# Patient Record
Sex: Female | Born: 1975 | Race: White | Hispanic: No | Marital: Single | Smoking: Never smoker
Health system: Southern US, Community
[De-identification: ages and names within clinical notes are randomized; demographics above are authoritative.]

## PROBLEM LIST (undated history)

## (undated) DIAGNOSIS — Z8669 Personal history of other diseases of the nervous system and sense organs: Secondary | ICD-10-CM

## (undated) DIAGNOSIS — E785 Hyperlipidemia, unspecified: Secondary | ICD-10-CM

## (undated) DIAGNOSIS — J45909 Unspecified asthma, uncomplicated: Secondary | ICD-10-CM

## (undated) DIAGNOSIS — E282 Polycystic ovarian syndrome: Secondary | ICD-10-CM

## (undated) DIAGNOSIS — N809 Endometriosis, unspecified: Secondary | ICD-10-CM

## (undated) HISTORY — PX: OTHER SURGICAL HISTORY: SHX169

## (undated) HISTORY — DX: Endometriosis, unspecified: N80.9

## (undated) HISTORY — DX: Hyperlipidemia, unspecified: E78.5

## (undated) HISTORY — PX: WISDOM TOOTH EXTRACTION: SHX21

## (undated) HISTORY — DX: Polycystic ovarian syndrome: E28.2

## (undated) HISTORY — DX: Unspecified asthma, uncomplicated: J45.909

## (undated) HISTORY — PX: SEPTOPLASTY: SUR1290

---
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
## 1997-12-11 ENCOUNTER — Other Ambulatory Visit: Admission: RE | Admit: 1997-12-11 | Discharge: 1997-12-11 | Payer: Self-pay | Admitting: Obstetrics and Gynecology

## 1998-11-05 ENCOUNTER — Encounter: Payer: Self-pay | Admitting: Emergency Medicine

## 1998-11-05 ENCOUNTER — Emergency Department (HOSPITAL_COMMUNITY): Admission: EM | Admit: 1998-11-05 | Discharge: 1998-11-06 | Payer: Self-pay | Admitting: Emergency Medicine

## 1999-02-21 ENCOUNTER — Encounter: Payer: Self-pay | Admitting: Internal Medicine

## 1999-02-21 ENCOUNTER — Ambulatory Visit (HOSPITAL_COMMUNITY): Admission: RE | Admit: 1999-02-21 | Discharge: 1999-02-21 | Payer: Self-pay | Admitting: Internal Medicine

## 2000-05-30 ENCOUNTER — Emergency Department (HOSPITAL_COMMUNITY): Admission: EM | Admit: 2000-05-30 | Discharge: 2000-05-30 | Payer: Self-pay | Admitting: *Deleted

## 2000-06-02 ENCOUNTER — Encounter: Admission: RE | Admit: 2000-06-02 | Discharge: 2000-06-02 | Payer: Self-pay | Admitting: Urology

## 2000-06-02 ENCOUNTER — Encounter: Payer: Self-pay | Admitting: Urology

## 2000-06-20 ENCOUNTER — Encounter: Payer: Self-pay | Admitting: Urology

## 2000-06-20 ENCOUNTER — Encounter: Admission: RE | Admit: 2000-06-20 | Discharge: 2000-06-20 | Payer: Self-pay | Admitting: Urology

## 2000-07-04 ENCOUNTER — Encounter: Payer: Self-pay | Admitting: Urology

## 2000-07-04 ENCOUNTER — Ambulatory Visit (HOSPITAL_COMMUNITY): Admission: RE | Admit: 2000-07-04 | Discharge: 2000-07-04 | Payer: Self-pay | Admitting: Urology

## 2002-03-14 ENCOUNTER — Other Ambulatory Visit: Admission: RE | Admit: 2002-03-14 | Discharge: 2002-03-14 | Payer: Self-pay | Admitting: Gynecology

## 2003-04-22 ENCOUNTER — Encounter (INDEPENDENT_AMBULATORY_CARE_PROVIDER_SITE_OTHER): Payer: Self-pay

## 2003-04-22 ENCOUNTER — Ambulatory Visit (HOSPITAL_BASED_OUTPATIENT_CLINIC_OR_DEPARTMENT_OTHER): Admission: RE | Admit: 2003-04-22 | Discharge: 2003-04-22 | Payer: Self-pay | Admitting: Gynecology

## 2003-04-22 ENCOUNTER — Ambulatory Visit (HOSPITAL_COMMUNITY): Admission: RE | Admit: 2003-04-22 | Discharge: 2003-04-22 | Payer: Self-pay | Admitting: Gynecology

## 2003-06-23 ENCOUNTER — Other Ambulatory Visit: Admission: RE | Admit: 2003-06-23 | Discharge: 2003-06-23 | Payer: Self-pay | Admitting: Gynecology

## 2004-05-05 ENCOUNTER — Ambulatory Visit: Payer: Self-pay | Admitting: Internal Medicine

## 2004-07-01 ENCOUNTER — Other Ambulatory Visit: Admission: RE | Admit: 2004-07-01 | Discharge: 2004-07-01 | Payer: Self-pay | Admitting: Gynecology

## 2004-09-03 ENCOUNTER — Ambulatory Visit: Payer: Self-pay | Admitting: Internal Medicine

## 2004-09-20 ENCOUNTER — Ambulatory Visit: Payer: Self-pay | Admitting: Internal Medicine

## 2004-11-19 ENCOUNTER — Ambulatory Visit: Payer: Self-pay | Admitting: Internal Medicine

## 2004-12-03 ENCOUNTER — Encounter: Admission: RE | Admit: 2004-12-03 | Discharge: 2005-03-03 | Payer: Self-pay | Admitting: Internal Medicine

## 2005-03-31 ENCOUNTER — Ambulatory Visit (HOSPITAL_BASED_OUTPATIENT_CLINIC_OR_DEPARTMENT_OTHER): Admission: RE | Admit: 2005-03-31 | Discharge: 2005-03-31 | Payer: Self-pay | Admitting: Otolaryngology

## 2005-03-31 ENCOUNTER — Ambulatory Visit (HOSPITAL_COMMUNITY): Admission: RE | Admit: 2005-03-31 | Discharge: 2005-03-31 | Payer: Self-pay | Admitting: Otolaryngology

## 2005-04-25 ENCOUNTER — Ambulatory Visit: Payer: Self-pay | Admitting: Internal Medicine

## 2005-05-10 ENCOUNTER — Ambulatory Visit: Payer: Self-pay | Admitting: Internal Medicine

## 2005-06-28 ENCOUNTER — Ambulatory Visit: Payer: Self-pay | Admitting: Internal Medicine

## 2005-07-04 ENCOUNTER — Other Ambulatory Visit: Admission: RE | Admit: 2005-07-04 | Discharge: 2005-07-04 | Payer: Self-pay | Admitting: Gynecology

## 2005-07-26 ENCOUNTER — Ambulatory Visit: Payer: Self-pay | Admitting: Internal Medicine

## 2005-08-10 ENCOUNTER — Ambulatory Visit: Payer: Self-pay | Admitting: Internal Medicine

## 2005-08-19 ENCOUNTER — Ambulatory Visit: Payer: Self-pay | Admitting: Internal Medicine

## 2005-08-23 ENCOUNTER — Ambulatory Visit: Payer: Self-pay | Admitting: Internal Medicine

## 2005-09-02 ENCOUNTER — Ambulatory Visit: Payer: Self-pay | Admitting: Internal Medicine

## 2006-07-18 DIAGNOSIS — E282 Polycystic ovarian syndrome: Secondary | ICD-10-CM | POA: Insufficient documentation

## 2006-07-19 ENCOUNTER — Other Ambulatory Visit: Admission: RE | Admit: 2006-07-19 | Discharge: 2006-07-19 | Payer: Self-pay | Admitting: Gynecology

## 2006-07-26 ENCOUNTER — Ambulatory Visit: Payer: Self-pay | Admitting: Internal Medicine

## 2006-11-09 ENCOUNTER — Ambulatory Visit: Payer: Self-pay | Admitting: Internal Medicine

## 2006-11-09 ENCOUNTER — Encounter: Payer: Self-pay | Admitting: Internal Medicine

## 2006-11-09 LAB — CONVERTED CEMR LAB
ALT: 18 units/L (ref 0–40)
Albumin: 4 g/dL (ref 3.5–5.2)
Alkaline Phosphatase: 74 units/L (ref 39–117)
BUN: 10 mg/dL (ref 6–23)
Basophils Absolute: 0 10*3/uL (ref 0.0–0.1)
Basophils Relative: 0.4 % (ref 0.0–1.0)
CO2: 28 meq/L (ref 19–32)
Calcium: 9.6 mg/dL (ref 8.4–10.5)
Cholesterol: 228 mg/dL (ref 0–200)
Eosinophils Absolute: 0.2 10*3/uL (ref 0.0–0.6)
GFR calc Af Amer: 94 mL/min
GFR calc non Af Amer: 78 mL/min
HDL: 73.9 mg/dL (ref >39.0)
Lymphocytes Relative: 32.5 % (ref 12.0–46.0)
MCHC: 34.9 g/dL (ref 30.0–36.0)
Monocytes Relative: 6.4 % (ref 3.0–11.0)
Neutro Abs: 3.6 10*3/uL (ref 1.4–7.7)
Platelets: 274 10*3/uL (ref 150–400)
Potassium: 4.3 meq/L (ref 3.5–5.1)
TSH: 3.56 microintl units/mL (ref 0.35–5.50)
VLDL: 32 mg/dL (ref 0–40)

## 2007-07-30 ENCOUNTER — Telehealth (INDEPENDENT_AMBULATORY_CARE_PROVIDER_SITE_OTHER): Payer: Self-pay | Admitting: *Deleted

## 2007-07-31 ENCOUNTER — Ambulatory Visit: Payer: Self-pay | Admitting: Internal Medicine

## 2007-08-10 ENCOUNTER — Ambulatory Visit: Payer: Self-pay | Admitting: Internal Medicine

## 2007-08-10 ENCOUNTER — Encounter (INDEPENDENT_AMBULATORY_CARE_PROVIDER_SITE_OTHER): Payer: Self-pay | Admitting: *Deleted

## 2007-08-10 LAB — CONVERTED CEMR LAB
ALT: 15 units/L (ref 0–35)
AST: 19 units/L (ref 0–37)
Basophils Relative: 1.2 % — ABNORMAL HIGH (ref 0.0–1.0)
Bilirubin, Direct: 0.1 mg/dL (ref 0.0–0.3)
CO2: 26 meq/L (ref 19–32)
Calcium: 9.5 mg/dL (ref 8.4–10.5)
Eosinophils Absolute: 0.3 10*3/uL (ref 0.0–0.6)
Eosinophils Relative: 4.3 % (ref 0.0–5.0)
GFR calc Af Amer: 107 mL/min
GFR calc non Af Amer: 88 mL/min
Glucose, Bld: 90 mg/dL (ref 70–99)
HCT: 41.5 % (ref 36.0–46.0)
Hemoglobin: 14.1 g/dL (ref 12.0–15.0)
Lymphocytes Relative: 41.1 % (ref 12.0–46.0)
MCV: 88.6 fL (ref 78.0–100.0)
Mono Screen: NEGATIVE
Neutro Abs: 3.2 10*3/uL (ref 1.4–7.7)
Neutrophils Relative %: 50.1 % (ref 43.0–77.0)
Sodium: 138 meq/L (ref 135–145)
Total Protein: 6.7 g/dL (ref 6.0–8.3)
WBC: 6.4 10*3/uL (ref 4.5–10.5)

## 2007-08-11 ENCOUNTER — Ambulatory Visit: Payer: Self-pay | Admitting: Family Medicine

## 2007-11-15 ENCOUNTER — Telehealth (INDEPENDENT_AMBULATORY_CARE_PROVIDER_SITE_OTHER): Payer: Self-pay | Admitting: *Deleted

## 2007-12-25 ENCOUNTER — Ambulatory Visit: Payer: Self-pay | Admitting: Internal Medicine

## 2008-04-01 ENCOUNTER — Encounter: Payer: Self-pay | Admitting: Internal Medicine

## 2008-04-02 ENCOUNTER — Ambulatory Visit: Payer: Self-pay | Admitting: Internal Medicine

## 2008-04-02 LAB — CONVERTED CEMR LAB: Cholesterol, target level: 200 mg/dL

## 2008-04-04 ENCOUNTER — Encounter (INDEPENDENT_AMBULATORY_CARE_PROVIDER_SITE_OTHER): Payer: Self-pay | Admitting: *Deleted

## 2008-04-04 LAB — CONVERTED CEMR LAB
ALT: 16 units/L (ref 0–35)
AST: 19 units/L (ref 0–37)
Alkaline Phosphatase: 70 units/L (ref 39–117)
Basophils Absolute: 0 10*3/uL (ref 0.0–0.1)
Bilirubin, Direct: 0.2 mg/dL (ref 0.0–0.3)
CO2: 26 meq/L (ref 19–32)
Calcium: 9.2 mg/dL (ref 8.4–10.5)
Chloride: 107 meq/L (ref 96–112)
Cholesterol: 165 mg/dL (ref 0–200)
Glucose, Bld: 81 mg/dL (ref 70–99)
LDL Cholesterol: 74 mg/dL (ref 0–99)
Lymphocytes Relative: 38.1 % (ref 12.0–46.0)
MCHC: 34.8 g/dL (ref 30.0–36.0)
Monocytes Relative: 6.5 % (ref 3.0–12.0)
Neutrophils Relative %: 51.8 % (ref 43.0–77.0)
Platelets: 238 10*3/uL (ref 150–400)
Potassium: 4.4 meq/L (ref 3.5–5.1)
RDW: 11.5 % (ref 11.5–14.6)
Sodium: 140 meq/L (ref 135–145)
TSH: 3.6 microintl units/mL (ref 0.35–5.50)
Total Bilirubin: 1 mg/dL (ref 0.3–1.2)
Total CHOL/HDL Ratio: 2.4
Triglycerides: 108 mg/dL (ref 0–149)
VLDL: 22 mg/dL (ref 0–40)

## 2008-05-19 ENCOUNTER — Ambulatory Visit: Payer: Self-pay | Admitting: Internal Medicine

## 2008-06-30 ENCOUNTER — Ambulatory Visit: Payer: Self-pay | Admitting: Internal Medicine

## 2008-06-30 DIAGNOSIS — J45990 Exercise induced bronchospasm: Secondary | ICD-10-CM | POA: Insufficient documentation

## 2008-07-09 ENCOUNTER — Telehealth (INDEPENDENT_AMBULATORY_CARE_PROVIDER_SITE_OTHER): Payer: Self-pay | Admitting: *Deleted

## 2008-07-17 ENCOUNTER — Telehealth (INDEPENDENT_AMBULATORY_CARE_PROVIDER_SITE_OTHER): Payer: Self-pay | Admitting: *Deleted

## 2009-04-13 ENCOUNTER — Ambulatory Visit: Payer: Self-pay | Admitting: Internal Medicine

## 2009-04-13 DIAGNOSIS — E785 Hyperlipidemia, unspecified: Secondary | ICD-10-CM | POA: Insufficient documentation

## 2009-04-14 ENCOUNTER — Encounter: Payer: Self-pay | Admitting: Internal Medicine

## 2009-04-17 ENCOUNTER — Encounter (INDEPENDENT_AMBULATORY_CARE_PROVIDER_SITE_OTHER): Payer: Self-pay | Admitting: *Deleted

## 2009-04-17 LAB — CONVERTED CEMR LAB
Albumin: 3.9 g/dL (ref 3.5–5.2)
Basophils Relative: 0.6 % (ref 0.0–3.0)
Bilirubin, Direct: 0.1 mg/dL (ref 0.0–0.3)
CO2: 27 meq/L (ref 19–32)
Chloride: 108 meq/L (ref 96–112)
Creatinine, Ser: 1 mg/dL (ref 0.4–1.2)
Eosinophils Absolute: 0.3 10*3/uL (ref 0.0–0.7)
Free T4: 0.8 ng/dL (ref 0.6–1.6)
Glucose, Bld: 85 mg/dL (ref 70–99)
MCHC: 34.4 g/dL (ref 30.0–36.0)
MCV: 92.7 fL (ref 78.0–100.0)
Monocytes Absolute: 0.3 10*3/uL (ref 0.1–1.0)
Neutro Abs: 2.6 10*3/uL (ref 1.4–7.7)
Neutrophils Relative %: 44.8 % (ref 43.0–77.0)
RBC: 4.54 M/uL (ref 3.87–5.11)
RDW: 11.6 % (ref 11.5–14.6)
Total Protein: 7 g/dL (ref 6.0–8.3)

## 2009-04-22 ENCOUNTER — Ambulatory Visit: Payer: Self-pay | Admitting: Internal Medicine

## 2010-05-26 ENCOUNTER — Ambulatory Visit: Payer: Self-pay | Admitting: Family Medicine

## 2010-06-11 ENCOUNTER — Ambulatory Visit: Payer: Self-pay | Admitting: Internal Medicine

## 2010-06-11 ENCOUNTER — Encounter: Payer: Self-pay | Admitting: Internal Medicine

## 2010-06-15 LAB — CONVERTED CEMR LAB
ALT: 16 units/L (ref 0–35)
BUN: 11 mg/dL (ref 6–23)
Bilirubin, Direct: 0.1 mg/dL (ref 0.0–0.3)
CO2: 27 meq/L (ref 19–32)
Chloride: 108 meq/L (ref 96–112)
Creatinine, Ser: 0.8 mg/dL (ref 0.4–1.2)
Eosinophils Absolute: 0.2 10*3/uL (ref 0.0–0.7)
Glucose, Bld: 75 mg/dL (ref 70–99)
HCT: 42 % (ref 36.0–46.0)
Lymphs Abs: 2.4 10*3/uL (ref 0.7–4.0)
MCHC: 34.3 g/dL (ref 30.0–36.0)
MCV: 91.9 fL (ref 78.0–100.0)
Monocytes Absolute: 0.3 10*3/uL (ref 0.1–1.0)
Neutrophils Relative %: 55.5 % (ref 43.0–77.0)
Platelets: 302 10*3/uL (ref 150.0–400.0)
Potassium: 4.5 meq/L (ref 3.5–5.1)
TSH: 2.62 microintl units/mL (ref 0.35–5.50)
Total Bilirubin: 0.8 mg/dL (ref 0.3–1.2)
Total Protein: 7.4 g/dL (ref 6.0–8.3)

## 2010-07-15 NOTE — Assessment & Plan Note (Signed)
Summary: HEAD & CHEST CONGESTION/RH.......Marland Kitchen   Vital Signs:  Patient profile:   35 year old female Height:      66.25 inches Weight:      154.2 pounds BMI:     24.79 O2 Sat:      98 % on Room air Temp:     98.6 degrees F oral Pulse rate:   65 / minute Pulse rhythm:   regular BP sitting:   108 / 64  (left arm) Cuff size:   regular  Vitals Entered By: Almeta Monas CMA Duncan Dull) (May 26, 2010 11:12 AM)  O2 Flow:  Room air CC: x1 week c/o cough, congestion, sinus pressure/headache and green bloody mucus, URI symptoms   History of Present Illness:       This is a 35 year old woman who presents with URI symptoms.  The symptoms began 3 weeks ago.  +b/l sinus pressure.  The patient complains of nasal congestion, purulent nasal discharge, productive cough, and sick contacts.  The patient denies fever, low-grade fever (<100.5 degrees), fever of 100.5-103 degrees, fever of 103.1-104 degrees, fever to >104 degrees, stiff neck, dyspnea, wheezing, rash, vomiting, diarrhea, use of an antipyretic, and response to antipyretic.  The patient also reports sneezing and headache.  The patient denies the following risk factors for Strep sinusitis: unilateral facial pain, unilateral nasal discharge, poor response to decongestant, double sickening, tooth pain, Strep exposure, tender adenopathy, and absence of cough.    Current Medications (verified): 1)  Lipitor 20 Mg Tabs (Atorvastatin Calcium) .Marland Kitchen.. 1 By Mouth 3 X Weekly 2)  Ocella 3-0.03 Mg  Tabs (Drospirenone-Ethinyl Estradiol) .Marland Kitchen.. 1 Qd 3)  Baby Aspirin 81 Mg  Chew (Aspirin) .Marland Kitchen.. 1 Qd 4)  Cortisporin 3.5-10000-1  Susp (Neomycin-Polymyxin-Hc) .... 3 Drops  Qid As Needed 5)  Loratadine 10 Mg  Tabs (Loratadine) .Marland Kitchen.. 1 By Mouth Once Daily As Needed 6)  Darvocet A500 100-500 Mg Tabs (Propoxyphene N-Apap) .Marland Kitchen.. 1-2 Q 6 Hrs As Needed 7)  Ventolin Hfa 108 (90 Base) Mcg/act Aers (Albuterol Sulfate) .Marland Kitchen.. 1-2 Puffs Q 4-6 Hrs As Needed Cough & Pre Exercise 8)   Singulair 10 Mg Tabs (Montelukast Sodium) .Marland Kitchen.. 1 Once Daily As Needed Cough 9)  Citalopram Hydrobromide 20 Mg Tabs (Citalopram Hydrobromide) .Marland Kitchen.. 1 Once Daily 10)  Augmentin 875-125 Mg Tabs (Amoxicillin-Pot Clavulanate) .Marland Kitchen.. 1 By Mouth Two Times A Day 11)  Flonase 50 Mcg/act Susp (Fluticasone Propionate) .... 2 Sprays Each Nostril Once Daily 12)  Cheratussin Ac 100-10 Mg/53ml Syrp (Guaifenesin-Codeine) .Marland Kitchen.. 1 -2 Tsp By Mouth At Bedtime As Needed Cough  Allergies (verified): 1)  ! Sulfa  Past History:  Past Medical History: Last updated: 04/13/2009 Renal calculus X1 (calcium stone); Endometriosis; Polycystic Ovary with PMH of elevated testosterone; EIB ,PMH of Hyperlipidemia: LDL 105 (1981 total/ 1794 small dense particles), HDL 66, TG 99; LDL goal = < 75  Past Surgical History: Last updated: 04/13/2009 Discectomy L 4 in  1990;ovarian cystectomy;septoplasty;otic tubes; labial cystectomy; G1 P0  Family History: Last updated: 04/13/2009 Father: DM,gout,DDD Mother: MI @ 49, smoker Siblings: neg; MGF   MI pre 20; PGF seizure; ; cousin DM  Social History: Last updated: 04/13/2009 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 Father: DM,gout,DDD Mother: MI @ 70, smoker Siblings: neg; MGF   MI pre 35; PGF seizure; ; cousin  DM  Social History: Reviewed history from 04/13/2009 and no changes required. 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  Otoscopic examination reveals clear canals, tympanic membranes are intact bilaterally without bulging, retraction, inflammation or discharge. Hearing is grossly normal bilaterally. Nose:  L frontal sinus tenderness, L maxillary sinus tenderness, R frontal sinus tenderness, and R maxillary sinus tenderness.   Mouth:  Oral mucosa and oropharynx without lesions or exudates.  Teeth in good repair. Neck:  No deformities, masses, or tenderness noted. Lungs:  Normal respiratory effort, chest expands symmetrically. Lungs are clear to auscultation, no crackles or wheezes. Heart:  normal rate and no murmur.   Extremities:  No clubbing, cyanosis, edema, or deformity noted with normal full range of motion of all joints.   Psych:  Cognition and judgment appear intact. Alert and cooperative with normal attention span and concentration. No apparent delusions, illusions, hallucinations   Impression & Recommendations:  Problem # 1:  SINUSITIS - ACUTE-NOS (ICD-461.9)  Her updated medication list for this problem includes:    Augmentin 875-125 Mg Tabs (Amoxicillin-pot clavulanate) .Marland Kitchen... 1 by mouth two times a day    Flonase 50 Mcg/act Susp (Fluticasone propionate) .Marland Kitchen... 2 sprays each nostril once daily    Cheratussin Ac 100-10 Mg/63ml Syrp (Guaifenesin-codeine) .Marland Kitchen... 1 -2 tsp by mouth at bedtime as needed cough  Instructed on treatment. Call if symptoms persist or worsen.   Complete Medication List: 1)  Lipitor 20 Mg Tabs (Atorvastatin calcium) .Marland Kitchen.. 1 by mouth 3 x weekly 2)  Ocella 3-0.03 Mg Tabs (Drospirenone-ethinyl estradiol) .Marland Kitchen.. 1 qd 3)  Baby Aspirin 81 Mg Chew (Aspirin) .Marland Kitchen.. 1 qd 4)  Cortisporin 3.5-10000-1 Susp (Neomycin-polymyxin-hc) .... 3 drops  qid as needed 5)  Loratadine 10 Mg Tabs (Loratadine) .Marland Kitchen.. 1 by mouth once daily as needed 6)  Darvocet A500 100-500 Mg Tabs (Propoxyphene n-apap) .Marland Kitchen.. 1-2 q 6 hrs as needed 7)  Ventolin Hfa 108 (90 Base) Mcg/act Aers  (Albuterol sulfate) .Marland Kitchen.. 1-2 puffs q 4-6 hrs as needed cough & pre exercise 8)  Singulair 10 Mg Tabs (Montelukast sodium) .Marland Kitchen.. 1 once daily as needed cough 9)  Citalopram Hydrobromide 20 Mg Tabs (Citalopram hydrobromide) .Marland Kitchen.. 1 once daily 10)  Augmentin 875-125 Mg Tabs (Amoxicillin-pot clavulanate) .Marland Kitchen.. 1 by mouth two times a day 11)  Flonase 50 Mcg/act Susp (Fluticasone propionate) .... 2 sprays each nostril once daily 12)  Cheratussin Ac 100-10 Mg/46ml Syrp (Guaifenesin-codeine) .Marland Kitchen.. 1 -2 tsp by mouth at bedtime as needed cough  Patient Instructions: 1)  Acute sinusitis symptoms for less than 10 days are not helped by antibiotics. Use warm moist compresses, and over the counter decongestants( only as directed). Call if no improvement in 5-7 days, sooner if increasing pain, fever, or new symptoms.  Prescriptions: CHERATUSSIN AC 100-10 MG/5ML SYRP (GUAIFENESIN-CODEINE) 1 -2 tsp by mouth at bedtime as needed cough  #6 oz x 0   Entered and Authorized by:   Loreen Freud DO   Signed by:   Loreen Freud DO on 05/26/2010   Method used:   Print then Give to Patient   RxID:   [id number] FLONASE 50 MCG/ACT SUSP (FLUTICASONE PROPIONATE) 2 sprays each nostril once daily  #1 x 0   Entered and Authorized by:  Loreen Freud DO   Signed by:   Loreen Freud DO on 05/26/2010   Method used:   Electronically to        [id number] (retail)       [address]       [place], [place]  [address]       Ph: [phone] or [phone]       Fax: [phone]   RxID:   [id number] AUGMENTIN 875-125 MG TABS (AMOXICILLIN-POT CLAVULANATE) 1 by mouth two times a day  #20 x 0   Entered and Authorized by:   Loreen Freud DO   Signed by:   Loreen Freud DO on 05/26/2010   Method used:   Electronically to        [id number] (retail)       [address]       [place], [place]  [address]       Ph: [phone] or [phone]       Fax: [phone]   RxID:   [id number]    Orders Added: 1)  Est.  Patient Level III [25427]

## 2010-07-15 NOTE — Assessment & Plan Note (Signed)
Summary: cpx / lab/cbs   Vital Signs:  Patient profile:   35 year old female Height:      66.25 inches Weight:      155.6 pounds BMI:     25.02 Temp:     98.8 degrees F oral Pulse rate:   56 / minute Resp:     14 per minute BP sitting:   112 / 70  (left arm) Cuff size:   large  Vitals Entered By: Shonna Chock CMA (June 11, 2010 10:03 AM)   History of Present Illness:    Cindy Pittman is here for a physical; she is asymptomatic. Hyperlipidemia Follow-Up:The patient denies muscle aches, GI upset, abdominal pain, flushing, itching, constipation, diarrhea, and fatigue.  The patient denies the following symptoms: chest pain/pressure, exercise intolerance, dypsnea, palpitations, syncope, and pedal edema.  Compliance with medications (by patient report) has been near 100%.  Dietary compliance has been good.  Adjunctive measures currently used by the patient include ASA.    Lipid Management History:      Positive NCEP/ATP III risk factors include family history for ischemic heart disease (females less than 22 years old & males less than 42 years old).  Negative NCEP/ATP III risk factors include female age less than 1 years old, no history of early menopause without estrogen hormone replacement, non-diabetic, HDL cholesterol greater than 60, non-tobacco-user status, non-hypertensive, no ASHD (atherosclerotic heart disease), no prior stroke/TIA, no peripheral vascular disease, and no history of aortic aneurysm.     Current Medications (verified): 1)  Lipitor 20 Mg Tabs (Atorvastatin Calcium) .Marland Kitchen.. 1 By Mouth 3 X Weekly 2)  Ocella 3-0.03 Mg  Tabs (Drospirenone-Ethinyl Estradiol) .Marland Kitchen.. 1 Qd 3)  Baby Aspirin 81 Mg  Chew (Aspirin) .Marland Kitchen.. 1 Qd 4)  Cortisporin 3.5-10000-1  Susp (Neomycin-Polymyxin-Hc) .... 3 Drops  Qid As Needed 5)  Loratadine 10 Mg  Tabs (Loratadine) .Marland Kitchen.. 1 By Mouth Once Daily As Needed 6)  Ventolin Hfa 108 (90 Base) Mcg/act Aers (Albuterol Sulfate) .Marland Kitchen.. 1-2 Puffs Q 4-6 Hrs As Needed Cough  & Pre Exercise 7)  Singulair 10 Mg Tabs (Montelukast Sodium) .Marland Kitchen.. 1 Once Daily As Needed Cough 8)  Flonase 50 Mcg/act Susp (Fluticasone Propionate) .... 2 Sprays Each Nostril Once Daily 9)  Cheratussin Ac 100-10 Mg/47ml Syrp (Guaifenesin-Codeine) .Marland Kitchen.. 1 -2 Tsp By Mouth At Bedtime As Needed Cough  Allergies: 1)  ! Sulfa  Past History:  Past Medical History: Renal calculus X1 (calcium stone, Dr Logan Bores); Endometriosis; Polycystic Ovary with PMH of elevated testosterone; EIB ,PMH of Hyperlipidemia: LDL 105 (1981 total/ 1794 small dense particles), HDL 66, TG 99; LDL goal = < 75. Framingham Study LDL goal = < 160.  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   Resp:  Denies excessive snoring, shortness of breath, sputum productive, and wheezing; Cough intermittently since sinusitis. MDI  pre exercise prevents EIB.  Physical Exam  General:  Thin,well-nourished;alert,appropriate and cooperative throughout examination Head:  Normocephalic and atraumatic without obvious  abnormalities.  Eyes:  No corneal or conjunctival inflammation noted.Perrla. Funduscopic exam benign, without hemorrhages, exudates or papilledema.  Ears:  External ear exam shows no significant lesions or deformities.  Otoscopic examination reveals clear canals, tympanic membranes are  intact bilaterally without bulging, retraction, inflammation or discharge. Hearing is grossly normal bilaterally.TMs slightly dull Nose:  External nasal examination shows no deformity or inflammation. Nasal mucosa are pink and moist without lesions or exudates. Mouth:  Oral mucosa and oropharynx without lesions or exudates.  Teeth in good repair. Neck:  No deformities, masses, or tenderness noted. Physiologic asymmetry  Lungs:  Normal respiratory effort, chest expands symmetrically. Lungs are clear to auscultation, no crackles or wheezes. Heart:  regular rhythm, no gallop, no rub, no JVD, no HJR, bradycardia, and grade 1/2  /6 systolic murmur LSB.   Abdomen:  Bowel sounds positive,abdomen soft and non-tender without masses, organomegaly or hernias noted. Aorta palpable w/o AAA Genitalia:  Dr Vickey Sages Msk:  No deformity or scoliosis noted of thoracic or lumbar spine.   Pulses:  R and L carotid,radial,dorsalis pedis and posterior tibial pulses are full and equal bilaterally Extremities:  No clubbing, cyanosis, edema, or deformity noted with normal full range of motion of all joints.   Neurologic:  alert & oriented X3 and DTRs symmetrical and normal.   Skin:  Intact without suspicious lesions or rashes Cervical Nodes:  No lymphadenopathy noted Axillary Nodes:  No palpable lymphadenopathy Psych:  memory intact for recent and remote, normally interactive, and good eye contact.     Impression & Recommendations:  Problem # 1:  ROUTINE GENERAL MEDICAL EXAM@HEALTH  CARE FACL (ICD-V70.0)  Orders: EKG w/ Interpretation (93000) Venipuncture (16109) TLB-BMP (Basic Metabolic Panel-BMET) (80048-METABOL) TLB-CBC Platelet - w/Differential (85025-CBCD) TLB-Hepatic/Liver Function Pnl (80076-HEPATIC) TLB-TSH (Thyroid Stimulating Hormone) (84443-TSH) T- * Misc. Laboratory test [phone])  Problem # 2:  HYPERLIPIDEMIA (ICD-272.4)  Her updated medication list for this problem includes:    Lipitor 20 Mg Tabs  (Atorvastatin calcium) .Marland Kitchen... 1 by mouth 3 x weekly  Orders: T- * Misc. Laboratory test [phone])  Problem # 3:  EXERCISE INDUCED BRONCHOSPASM (ICD-493.81) controlled; some cough post recent  RTI Her updated medication list for this problem includes:    Ventolin Hfa 108 (90 Base) Mcg/act Aers (Albuterol sulfate) .Marland Kitchen... 1-2 puffs q 4-6 hrs as needed cough & pre exercise    Singulair 10 Mg Tabs (Montelukast sodium) .Marland Kitchen... 1 once daily as needed cough    Qvar 40 Mcg/act Aers (Beclomethasone dipropionate) .Marland Kitchen... 1-2 puffs every 12 hrs as needed ; gargle & spit after use  Problem # 4:  ISCHEMIC HEART DISEASE, PREMATURE, FAMILY HX (ICD-V17.3) M & MGF : premature MI Orders: T- * Misc. Laboratory test [phone])  Complete Medication List: 1)  Lipitor 20 Mg Tabs (Atorvastatin calcium) .Marland Kitchen.. 1 by mouth 3 x weekly 2)  Ocella 3-0.03 Mg Tabs (Drospirenone-ethinyl estradiol) .Marland Kitchen.. 1 qd 3)  Baby Aspirin 81 Mg Chew (Aspirin) .Marland Kitchen.. 1 qd 4)  Cortisporin 3.5-10000-1 Susp (Neomycin-polymyxin-hc) .... 3 drops  qid as needed 5)  Loratadine 10 Mg Tabs (Loratadine) .Marland Kitchen.. 1 by mouth once daily as needed 6)  Ventolin Hfa 108 (90 Base) Mcg/act Aers (Albuterol sulfate) .Marland Kitchen.. 1-2 puffs q 4-6 hrs as needed cough & pre exercise 7)  Singulair 10 Mg Tabs (Montelukast sodium) .Marland Kitchen.. 1 once daily as needed cough 8)  Flonase 50 Mcg/act Susp (Fluticasone propionate) .... 2 sprays each nostril once daily 9)  Cheratussin Ac 100-10 Mg/78ml Syrp (Guaifenesin-codeine) .Marland Kitchen.. 1 -2 tsp by  mouth at bedtime as needed cough 10)  Qvar 40 Mcg/act Aers (Beclomethasone dipropionate) .Marland Kitchen.. 1-2 puffs every 12 hrs as needed ; gargle & spit after use  Lipid Assessment/Plan:      Based on NCEP/ATP III, the patient's risk factor category is "0-1 risk factors".  The patient's lipid goals are as follows: Total cholesterol goal is 200; LDL cholesterol goal is 75; HDL cholesterol goal is 50; Triglyceride goal is 150.  Her LDL cholesterol goal has not been met.   Secondary causes for hyperlipidemia have been ruled out.  She has been counseled on adjunctive measures for lowering her cholesterol and has been provided with dietary instructions.    Patient Instructions: 1)  Continue QVAR until cough is gone for @ least 48 hrs. Prescriptions: QVAR 40 MCG/ACT AERS (BECLOMETHASONE DIPROPIONATE) 1-2 puffs every 12 hrs as needed ; gargle & spit after use  #1 x 0   Entered and Authorized by:   Marga Melnick MD   Signed by:   Marga Melnick MD on 06/11/2010   Method used:   Print then Give to Patient   RxID:   [id number] CORTISPORIN 3.5-10000-1  SUSP (NEOMYCIN-POLYMYXIN-HC) 3 drops  qid as needed  #10cc x 0   Entered and Authorized by:   Marga Melnick MD   Signed by:   Marga Melnick MD on 06/11/2010   Method used:   Print then Give to Patient   RxID:   [id number] SINGULAIR 10 MG TABS (MONTELUKAST SODIUM) 1 once daily as needed cough  #30 Tablet x 11   Entered and Authorized by:   Marga Melnick MD   Signed by:   Marga Melnick MD on 06/11/2010   Method used:   Print then Give to Patient   RxID:   [id number] VENTOLIN HFA 108 (90 BASE) MCG/ACT AERS (ALBUTEROL SULFATE) 1-2 puffs q 4-6 hrs as needed cough & pre exercise  #18 Not Speci x 5   Entered and Authorized by:   Marga Melnick MD   Signed by:   Marga Melnick MD on 06/11/2010   Method used:   Print then Give to Patient   RxID:   [id number] LIPITOR 20 MG TABS (ATORVASTATIN CALCIUM) 1 by mouth 3 X WEEKLY  #30 x 5   Entered and Authorized by:   Marga Melnick MD   Signed by:   Marga Melnick MD on 06/11/2010   Method used:   Print then Give to Patient   RxID:   [id number]    Orders Added: 1)  Est. Patient 18-39 years [99395] 2)  EKG w/ Interpretation [93000] 3)  Venipuncture [36415] 4)  TLB-BMP (Basic Metabolic Panel-BMET) [80048-METABOL] 5)  TLB-CBC Platelet - w/Differential [85025-CBCD] 6)  TLB-Hepatic/Liver Function Pnl [80076-HEPATIC] 7)  TLB-TSH (Thyroid  Stimulating Hormone) [84443-TSH] 8)  T- * Misc. Laboratory test [99999]   Immunization History:  Tetanus/Td Immunization History:    Tetanus/Td:  tdap (10/12/2006)   Immunization History:  Tetanus/Td Immunization History:    Tetanus/Td:  Tdap (10/12/2006)

## 2010-07-26 ENCOUNTER — Encounter: Payer: Self-pay | Admitting: Internal Medicine

## 2010-07-26 ENCOUNTER — Ambulatory Visit (INDEPENDENT_AMBULATORY_CARE_PROVIDER_SITE_OTHER): Payer: BC Managed Care – PPO | Admitting: Internal Medicine

## 2010-07-26 DIAGNOSIS — R079 Chest pain, unspecified: Secondary | ICD-10-CM

## 2010-07-26 DIAGNOSIS — Z8249 Family history of ischemic heart disease and other diseases of the circulatory system: Secondary | ICD-10-CM

## 2010-07-26 DIAGNOSIS — E785 Hyperlipidemia, unspecified: Secondary | ICD-10-CM

## 2010-07-27 LAB — CONVERTED CEMR LAB
Total CK: 50 units/L (ref 7–177)
Troponin I: <0.01 ng/mL (ref <0.06)

## 2010-08-04 NOTE — Assessment & Plan Note (Signed)
Summary: Tightness in chest between breast, denies any additional symp...   Vital Signs:  Patient profile:   35 year old female Weight:      159.4 pounds BMI:     25.63 Temp:     98.4 degrees F oral Pulse rate:   64 / minute Resp:     12 per minute BP sitting:   114 / 70  (left arm) Cuff size:   large  Vitals Entered By: Shonna Chock CMA (July 26, 2010 3:51 PM) CC: Chest tightness occ/on x 1 week. Tightness lasted almost all day on yesterday. Patient also c/o lip/tongue feeling wierd at times and fatigue (more than usual)   CC:  Chest tightness occ/on x 1 week. Tightness lasted almost all day on yesterday. Patient also c/o lip/tongue feeling wierd at times and fatigue (more than usual).  History of Present Illness:    Onset as SS  tightness last week intermittently; it has been constant since 02/12, but it has improved today w/o Rx. She  reports resting chest pain and palpitations, but denies nausea, vomiting, diaphoresis, shortness of breath, dizziness, light headedness, syncope, and indigestion.  The SS  pain is described as sharp and dull  &  does not radiate.  The pain is brought on or made worse by upper body movement, ie, flexing @ waist.  The pain is relieved or improved with rest, taking a nap.  FH + for premature CAD/MI( MGF & mother)    Hyperlipidemia Follow-Up: [place] Heart Panel reviewed & risks discussed. The patient reports some  fatigue, but denies muscle aches, abdominal pain, flushing, itching, constipation, and diarrhea.  Compliance with medications (by patient report) has been near 100%.  Dietary compliance has been good.  The patient reports exercising occasionally, 1-2X/ week recently. Framingham LDL goal = < 160; NMR Lipoprofile LDL goal = < 75.  Current Medications (verified): 1)  Lipitor 20 Mg Tabs (Atorvastatin Calcium) .Marland Kitchen.. 1 By Mouth 3 X Weekly 2)  Jolessa 0.15-0.03 Mg Tabs (Levonorgest-Eth Estrad 91-Day) .... As Directed 3)  Baby Aspirin 81 Mg  Chew  (Aspirin) .Marland Kitchen.. 1 Qd 4)  Cortisporin 3.5-10000-1  Susp (Neomycin-Polymyxin-Hc) .... 3 Drops  Qid As Needed 5)  Loratadine 10 Mg  Tabs (Loratadine) .Marland Kitchen.. 1 By Mouth Once Daily As Needed 6)  Ventolin Hfa 108 (90 Base) Mcg/act Aers (Albuterol Sulfate) .Marland Kitchen.. 1-2 Puffs Q 4-6 Hrs As Needed Cough & Pre Exercise 7)  Singulair 10 Mg Tabs (Montelukast Sodium) .Marland Kitchen.. 1 Once Daily As Needed Cough  Allergies: 1)  ! Sulfa  Physical Exam  General:  Thin but well-nourished,in no acute distress; alert,appropriate and cooperative throughout examination Eyes:  No corneal or conjunctival inflammation noted.No icterus Mouth:  Oral mucosa and oropharynx without lesions or exudates.  Teeth in good repair. No pharyngeal erythema.   Lungs:  Normal respiratory effort, chest expands symmetrically. Lungs are clear to auscultation, no crackles or wheezes. Heart:  Normal rate and regular rhythm. S1 and S2 normal without gallop, murmur, click, rub or other extra sounds.S4 Abdomen:  Bowel sounds positive,abdomen soft and non-tender without masses, organomegaly or hernias noted. Aorta palpable w/o AAA Pulses:  R and L carotid,radial,dorsalis pedis and posterior tibial pulses are full and equal bilaterally Extremities:  No clubbing, cyanosis, edema. Skin:  Intact without suspicious lesions or rashes Cervical Nodes:  No lymphadenopathy noted Axillary Nodes:  No palpable lymphadenopathy Psych:  memory intact for recent and remote, normally interactive, and good eye contact.     Impression &  Recommendations:  Problem # 1:  CHEST PAIN (ICD-786.50)  Orders: Venipuncture (21308) EKG w/ Interpretation (93000)  Problem # 2:  ISCHEMIC HEART DISEASE, PREMATURE, FAMILY HX (ICD-V17.3)  Orders: Venipuncture (65784) EKG w/ Interpretation (93000)  Problem # 3:  HYPERLIPIDEMIA (ICD-272.4)  The following medications were removed from the medication list:    Lipitor 20 Mg Tabs (Atorvastatin calcium) .Marland Kitchen... 1 by mouth 3 x  weekly Her updated medication list for this problem includes:    Crestor 10 Mg Tabs (Rosuvastatin calcium) .Marland Kitchen... 1 once daily  Orders: Venipuncture (69629) EKG w/ Interpretation (93000)  Complete Medication List: 1)  Jolessa 0.15-0.03 Mg Tabs (Levonorgest-eth estrad 91-day) .... As directed 2)  Baby Aspirin 81 Mg Chew (Aspirin) .Marland Kitchen.. 1 qd 3)  Cortisporin 3.5-10000-1 Susp (Neomycin-polymyxin-hc) .... 3 drops  qid as needed 4)  Loratadine 10 Mg Tabs (Loratadine) .Marland Kitchen.. 1 by mouth once daily as needed 5)  Ventolin Hfa 108 (90 Base) Mcg/act Aers (Albuterol sulfate) .Marland Kitchen.. 1-2 puffs q 4-6 hrs as needed cough & pre exercise 6)  Singulair 10 Mg Tabs (Montelukast sodium) .Marland Kitchen.. 1 once daily as needed cough 7)  Crestor 10 Mg Tabs (Rosuvastatin calcium) .Marland Kitchen.. 1 once daily 8)  Ranitidine Hcl 150 Mg Tabs (Ranitidine hcl) .Marland Kitchen.. 1 two times a day pre meals  Patient Instructions: 1)  Please schedule a follow-up appointment in 3 months. 2)  Hepatic Panel prior to visit, ICD-9:995.20 3)  Lipid Panel prior to visit, ICD-9:272.4, V17.3 Prescriptions: RANITIDINE HCL 150 MG TABS (RANITIDINE HCL) 1 two times a day pre meals  #60 x 2   Entered and Authorized by:   Marga Melnick MD   Signed by:   Marga Melnick MD on 07/26/2010   Method used:   Print then Give to Patient   RxID:   [id number] CRESTOR 10 MG TABS (ROSUVASTATIN CALCIUM) 1 once daily  #30 x 2   Entered and Authorized by:   Marga Melnick MD   Signed by:   Marga Melnick MD on 07/26/2010   Method used:   Print then Give to Patient   RxID:   [id number]    Orders Added: 1)  Venipuncture [87564] 2)  Est. Patient Level IV [33295] 3)  EKG w/ Interpretation [93000]

## 2010-08-09 ENCOUNTER — Encounter: Payer: Self-pay | Admitting: Internal Medicine

## 2010-08-09 ENCOUNTER — Ambulatory Visit (INDEPENDENT_AMBULATORY_CARE_PROVIDER_SITE_OTHER): Payer: BC Managed Care – PPO | Admitting: Internal Medicine

## 2010-08-09 DIAGNOSIS — J209 Acute bronchitis, unspecified: Secondary | ICD-10-CM

## 2010-08-09 DIAGNOSIS — J019 Acute sinusitis, unspecified: Secondary | ICD-10-CM | POA: Insufficient documentation

## 2010-08-19 NOTE — Assessment & Plan Note (Signed)
Summary: congested/cbs   Vital Signs:  Patient profile:   35 year old female Weight:      160.4 pounds BMI:     25.79 Temp:     98.5 degrees F oral Pulse rate:   60 / minute Resp:     14 per minute BP sitting:   110 / 76  (left arm) Cuff size:   large  Vitals Entered By: Shonna Chock CMA (August 09, 2010 3:44 PM) CC: Congestion, off/on since December 2011, URI symptoms   CC:  Congestion, off/on since December 2011, and URI symptoms.  History of Present Illness:    Onset since 05/2010 of intermittent RTI symptoms; recurrence most recently 02/22 as head congestion & malaise. She now  reports nasal congestion, purulent nasal discharge, and productive cough, but denies earache.  The patient denies fever, dyspnea, and wheezing.  The patient also reports  frontal headache & bilateral facial pain.  The patient denies the following risk factors for Strep sinusitis: tooth pain and tender adenopathy.  Rx: Mucinex  Current Medications (verified): 1)  Jolessa 0.15-0.03 Mg Tabs (Levonorgest-Eth Estrad 91-Day) .... As Directed 2)  Baby Aspirin 81 Mg  Chew (Aspirin) .Marland Kitchen.. 1 Qd 3)  Cortisporin 3.5-10000-1  Susp (Neomycin-Polymyxin-Hc) .... 3 Drops  Qid As Needed 4)  Loratadine 10 Mg  Tabs (Loratadine) .Marland Kitchen.. 1 By Mouth Once Daily As Needed 5)  Ventolin Hfa 108 (90 Base) Mcg/act Aers (Albuterol Sulfate) .Marland Kitchen.. 1-2 Puffs Q 4-6 Hrs As Needed Cough & Pre Exercise 6)  Singulair 10 Mg Tabs (Montelukast Sodium) .Marland Kitchen.. 1 Once Daily As Needed Cough 7)  Crestor 10 Mg Tabs (Rosuvastatin Calcium) .Marland Kitchen.. 1 Once Daily 8)  Ranitidine Hcl 150 Mg Tabs (Ranitidine Hcl) .Marland Kitchen.. 1 Two Times A Day Pre Meals  Allergies: 1)  ! Sulfa  Physical Exam  General:  well-nourished,in no acute distress; alert,appropriate and cooperative throughout examination Ears:  External ear exam shows no significant lesions or deformities.  Otoscopic examination reveals clear canals, tympanic membranes are intact bilaterally without bulging,  retraction, inflammation or discharge. Hearing is grossly normal bilaterally. Nose:  External nasal examination shows no deformity or inflammation. Nasal mucosa are  miodly erythematous without lesions or exudates. Mouth:  Oral mucosa and oropharynx without lesions or exudates.  Teeth in good repair. Lungs:  Normal respiratory effort, chest expands symmetrically. Lungs are clear to auscultation, no crackles or wheezes. Heart:  regular rhythm, no murmur, no gallop, no rub, and bradycardia.   Skin:  Intact without suspicious lesions or rashes; slightly damp Cervical Nodes:  No lymphadenopathy noted Axillary Nodes:  No palpable lymphadenopathy   Impression & Recommendations:  Problem # 1:  SINUSITIS- ACUTE-NOS (ICD-461.9)  Her updated medication list for this problem includes:    Cefuroxime Axetil 500 Mg Tabs (Cefuroxime axetil) .Marland Kitchen... 1 two times a day  Problem # 2:  BRONCHITIS-ACUTE (ICD-466.0)  Her updated medication list for this problem includes:    Ventolin Hfa 108 (90 Base) Mcg/act Aers (Albuterol sulfate) .Marland Kitchen... 1-2 puffs q 4-6 hrs as needed cough & pre exercise    Singulair 10 Mg Tabs (Montelukast sodium) .Marland Kitchen... 1 once daily as needed cough    Cefuroxime Axetil 500 Mg Tabs (Cefuroxime axetil) .Marland Kitchen... 1 two times a day  Complete Medication List: 1)  Jolessa 0.15-0.03 Mg Tabs (Levonorgest-eth estrad 91-day) .... As directed 2)  Baby Aspirin 81 Mg Chew (Aspirin) .Marland Kitchen.. 1 qd 3)  Cortisporin 3.5-10000-1 Susp (Neomycin-polymyxin-hc) .... 3 drops  qid as needed 4)  Loratadine 10  Mg Tabs (Loratadine) .Marland Kitchen.. 1 by mouth once daily as needed 5)  Ventolin Hfa 108 (90 Base) Mcg/act Aers (Albuterol sulfate) .Marland Kitchen.. 1-2 puffs q 4-6 hrs as needed cough & pre exercise 6)  Singulair 10 Mg Tabs (Montelukast sodium) .Marland Kitchen.. 1 once daily as needed cough 7)  Crestor 10 Mg Tabs (Rosuvastatin calcium) .Marland Kitchen.. 1 once daily 8)  Ranitidine Hcl 150 Mg Tabs (Ranitidine hcl) .Marland Kitchen.. 1 two times a day pre meals 9)  Cefuroxime  Axetil 500 Mg Tabs (Cefuroxime axetil) .Marland Kitchen.. 1 two times a day  Patient Instructions: 1)  Neti pot once daily - two times a day as needed  followed by Fluticasone . 2)  Drink as much  NON dairy fluid as you can tolerate for the next few days. Prescriptions: CEFUROXIME AXETIL 500 MG TABS (CEFUROXIME AXETIL) 1 two times a day  #20 x 0   Entered and Authorized by:   Marga Melnick MD   Signed by:   Marga Melnick MD on 08/09/2010   Method used:   Electronically to        [id number] (retail)       [address]       [place], [place]  [address]       Ph: [phone] or [phone]       Fax: [phone]   RxID:   [id number]    Orders Added: 1)  Est. Patient Level III [60109]

## 2010-10-22 ENCOUNTER — Other Ambulatory Visit: Payer: Self-pay | Admitting: *Deleted

## 2010-10-22 DIAGNOSIS — T887XXA Unspecified adverse effect of drug or medicament, initial encounter: Secondary | ICD-10-CM

## 2010-10-22 DIAGNOSIS — Z8249 Family history of ischemic heart disease and other diseases of the circulatory system: Secondary | ICD-10-CM

## 2010-10-22 DIAGNOSIS — E785 Hyperlipidemia, unspecified: Secondary | ICD-10-CM

## 2010-10-25 ENCOUNTER — Other Ambulatory Visit (INDEPENDENT_AMBULATORY_CARE_PROVIDER_SITE_OTHER): Payer: BC Managed Care – PPO

## 2010-10-25 DIAGNOSIS — T887XXA Unspecified adverse effect of drug or medicament, initial encounter: Secondary | ICD-10-CM

## 2010-10-25 DIAGNOSIS — E785 Hyperlipidemia, unspecified: Secondary | ICD-10-CM

## 2010-10-25 LAB — LIPID PANEL
HDL: 42.5 mg/dL (ref >39.00)
Triglycerides: 128 mg/dL (ref 0.0–149.0)
VLDL: 25.6 mg/dL (ref 0.0–40.0)

## 2010-10-25 LAB — HEPATIC FUNCTION PANEL
Albumin: 3.9 g/dL (ref 3.5–5.2)
Alkaline Phosphatase: 61 U/L (ref 39–117)
Bilirubin, Direct: 0.1 mg/dL (ref 0.0–0.3)
Total Protein: 6.8 g/dL (ref 6.0–8.3)

## 2010-10-29 NOTE — Op Note (Signed)
[facility]  Patient:    Cindy Pittman, Cindy Pittman                         MRN: [id number] Proc. Date: 07/04/00 Attending:  Jamison Neighbor, M.D. CC:         Titus Dubin. Alwyn Ren, M.D. [facility]   Operative Report  PREOPERATIVE DIAGNOSIS:  Left ureterovesical junction stone.  POSTOPERATIVE DIAGNOSIS:  Left ureterovesical junction stone.  OPERATION:  Cystoscopy, left retrograde, left basket extraction, left ureteroscopy.  SURGEON:  Jamison Neighbor, M.D.  ANESTHESIA:  General.  COMPLICATIONS:  None.  DRAINS:  None.  BRIEF HISTORY:  This 35 year old female underwent hematuria evaluation.  She was found to have a stone in the distal ureter.  She was given several attempts to try and pass this, but the stone is stuck at the UVJ.  The patient has been mildly symptomatic but is ready to have this stone extracted.  She understands the risks and benefits of the procedure including the possibility that a stent will be required.  She gave full and informed consent.  DESCRIPTION OF PROCEDURE:  After the successful induction of general anesthesia, the patient was placed in the dorsal lithotomy position and prepped with Betadine and draped in the usual sterile fashion.  Cystoscopy was performed, and the bladder was carefully inspected.  It was free of any tumors or stones.  The right ureteral orifice was normal in configuration and location.  The left ureteral orifice was very small and poking out from there, the stone could be visualized.  Attempt to grasp this with a stone grasper was unsuccessful.  A basket was passed beyond, and under fluoroscope control, the stone was extracted and fell into the bladder.  The ureteroscope was then inserted along the course of the ureter to make sure there was no damage to the ureter.  No other stones could be seen.  This was wide open.  Since there was no need for dilation, no drain was left.  The retrograde study had been performed, and  this showed no other filling defects.  It showed a completely normal upper pole collecting system and good drain out.  The stone was pulled from the bladder by irrigation.  This will be sent for stone analysis.  The patient tolerated the procedure well and was taken to the recovery room in good condition.  She will require no new prescriptions.  She will return to the office in two to three weeks time. DD:  07/04/00 TD:  07/04/00 Job: [id number]

## 2010-10-29 NOTE — Op Note (Signed)
   Cindy Pittman, Cindy Pittman                          ACCOUNT NO.:  [id number]   MEDICAL RECORD NO.:  [id number]                   PATIENT TYPE:  AMB   LOCATION:  [facility]                                 FACILITY:  [facility]   PHYSICIAN:  Gretta Cool, M.D.              DATE OF BIRTH:  [birth year]   DATE OF PROCEDURE:  04/22/2003  DATE OF DISCHARGE:                                 OPERATIVE REPORT   PREOPERATIVE DIAGNOSES:  1. Endometrial polyp with abnormal uterine bleeding, on hormonal     contraception.  2. Endometriosis with cyclic pelvic pain.   POSTOPERATIVE DIAGNOSES:  1. Endometrial polyp with abnormal uterine bleeding, on hormonal     contraception.  2. Endometriosis with cyclic pelvic pain.   PROCEDURE:  Hysteroscopy, resection of endometrial polyp.   SURGEON:  Gretta Cool, M.D.   ANESTHESIA:  IV sedation and paracervical block.   DESCRIPTION OF PROCEDURE:  Under excellent IV sedation with the patient  prepped and draped in Allen stirrups the cervix was grasped with a single  tooth tenaculum and pulled down into view.  It was then progressively  dilated with a series of Pratt dilators to accommodate the 7 mm  resectoscope.  The endometrial cavity was then systematically evaluated by  the hysteroscope.  Upon entering the cavity a polypoid structure on the  posterior wall was noted and was largely removed by insertion of the scope.  The remainder of it was then removed with a resectoscope down to the base of  the myometrium.  At this point careful attention was paid to the cavity.  There appeared to be somewhat of a muscular thickening in the center of the  fundal wall with deep recesses of the cornual area suggesting a subseptate  uterus.  The cavity appeared to be quite adequate in size to accommodate a  normal pregnancy, however.  At this point no attempt was made to resect the  myometrium.  Careful documentation was performed.  The endometrium was quite  thin on  hormonal contraception.  At this point the procedure was terminated  without complication and the patient returned to the recovery room in  excellent condition.   FLUID DEFICIT:  Less than 10 mL.   COMPLICATIONS:  None.                                               Gretta Cool, M.D.    CWL/MEDQ  D:  04/22/2003  T:  04/22/2003  Job:  [id number]

## 2010-10-29 NOTE — Op Note (Signed)
Cindy Pittman, Cindy Pittman                ACCOUNT NO.:  [id number]   MEDICAL RECORD NO.:  [id number]          PATIENT TYPE:  AMB   LOCATION:  [facility]                          FACILITY:  [facility]   PHYSICIAN:  Suzanna Obey, M.D.       DATE OF BIRTH:  09/30/1975   DATE OF PROCEDURE:  03/31/2005  DATE OF DISCHARGE:                                 OPERATIVE REPORT   PREOPERATIVE DIAGNOSIS:  Deviated septum and eustachian tube dysfunction.   POSTOPERATIVE DIAGNOSIS:  Deviated septum and eustachian tube dysfunction.   OPERATION PERFORMED:  Bilateral myringotomy with tubes and septoplasty.   SURGEON:  Suzanna Obey, M.D.   ANESTHESIA:  General endotracheal tube.   ESTIMATED BLOOD LOSS:  Less than approximately 5 mL.   INDICATIONS FOR PROCEDURE:  This is a 35 year old who has had nasal  obstruction for a long time that has been refractory to medical therapy.  She has deviation of the septum to the left.  She has also has recurrent  episodes of pressure and discomfort with her ears.  This has been treated  for eustachian tube dysfunction without resolution and she now wants to  proceed with the tubes.  She was informed of the risks and benefits of the  procedure including bleeding, infection, perforation, chronic drainage,  hearing loss, hole in the septum, change in external appearance of the nose,  chronic crusting and drying, numbness of the teeth, and risks of the  anesthetic.  All questions were answered and consent was obtained.   DESCRIPTION OF PROCEDURE:  The patient was taken to the operating room and  placed in supine position.  After adequate general endotracheal tube  anesthesia, she was placed in the left gaze position.  Cerumen was cleaned  and the tympanic membrane was examined under otomicroscope direction.  A  myringotomy made in the inferior quadrant and no effusion was in the middle  ear.  Sheehy tube placed.  Ciprodex was instilled.  The left ear was  repeated in the same fashion.  No  effusions.  Sheehy tube placed.  Ciprodex  was instilled.  No evidence of cholesteatoma or retraction.  The table was  then positioned in supine position and prepped and draped in the usual  sterile manner.  Oxymetazoline pledgets were placed and the septum was  injected with 1% lidocaine with 1:100,000 epinephrine.  A left  hemitransfixion was performed raising the mucoperichondrial and  mucoperiosteal flap.  The cartilage was divided about 2 cm posterior to the  caudal strut and this cartilage was removed with a Therapist, nutritional.  The bony  portion of the septum was then removed with a Jansen-Middleton forceps.  The  inferior spur was removed with a 4 mm osteotome.  This corrected the septal  deflection.  Hemitransfixion incision was closed with interrupted 4-0  chromic and a 4-0 plain cut quilting stitch placed to the septum.  There was  good hemostasis so no packing was  placed.  The nasopharynx was suctioned out of all blood and debris and the  oral cavity and oropharynx suctioned out of all blood and  debris under  direct visualization.  The patient was then awakened and brought to recovery  in stable condition, counts correct.           ______________________________  Suzanna Obey, M.D.     JB/MEDQ  D:  03/31/2005  T:  03/31/2005  Job:  [id number]   cc:   Titus Dubin. Alwyn Ren, M.D. [facility]  [phone]  [place] [address]

## 2010-11-01 ENCOUNTER — Encounter: Payer: Self-pay | Admitting: Internal Medicine

## 2010-11-01 ENCOUNTER — Ambulatory Visit (INDEPENDENT_AMBULATORY_CARE_PROVIDER_SITE_OTHER): Payer: BC Managed Care – PPO | Admitting: Internal Medicine

## 2010-11-01 DIAGNOSIS — E785 Hyperlipidemia, unspecified: Secondary | ICD-10-CM

## 2010-11-01 DIAGNOSIS — G4700 Insomnia, unspecified: Secondary | ICD-10-CM

## 2010-11-01 MED ORDER — ROSUVASTATIN CALCIUM 20 MG PO TABS: 20.0000 mg | ORAL_TABLET | Freq: Every day | ORAL | Status: DC

## 2010-11-01 MED ORDER — ZOLPIDEM TARTRATE 10 MG PO TABS: 10.0000 mg | ORAL_TABLET | Freq: Every evening | ORAL | Status: DC | PRN

## 2010-11-01 NOTE — Assessment & Plan Note (Signed)
NMR Lipoprofile 2010:LDL 88( 1396/810), HDL 48, TG 80. LDL goal = < 85. Mother MI @ 80.

## 2010-11-01 NOTE — Patient Instructions (Addendum)
Monitor a routine fasting lipid profile annually. Please bring the advanced cholesterol testing (NMR LipoProfile and Boston heart panel), anytime lipid risk will be discussed.  Please do not drink caffeine with or  after the evening meal and do not watch TV in bed.

## 2010-11-01 NOTE — Progress Notes (Signed)
  Subjective:    Patient ID: Cindy Pittman, female    DOB: [birth year], 35 y.o.   MRN: [id number]  HPI Dyslipidemia assessment: Lab results reviewed :  LDL 92 & HDL 43 on 10 mg Crestor .   Prior Advanced Lipid Testing: NMR & [facility] Heart Panel.   Family history of premature CAD/ MI: mother @ 51 . Nutrition: "OK" .  Exercise: 2-3X/ week as cardio & circuit training . Diabetes : no .  Smoking history  : never .  HTN: no  .   Weight :  stable. ROS: fatigue: no ; chest pain : no ;claudication: no; palpitations: no; abd pain/bowel changes: no ; myalgias:no;  syncope : no ; memory loss: no;skin changes: no.    Insomnia Onset:x4-6 weeks Pattern: Difficulty going to sleep:intermittently Frequent awakening:no Early awakening:4-5 am Nightmares:no Abnormal leg movement:no Snoring:no Apnea:no Risk factors/sleep hygiene: Stimulants:3 Diet Dr Alcus Dad / day, last @ eve meal Alcohol intake:no Reading, watching TV, eating @ bedtime:TV Daytime naps:no Stress/anxiety:work "busy" ( working full time & going to school) Work/travel factors:no Impact: Daytime hypersomnolence: in afternoon Motor vehicle accident/motor dysfunction:no Treatment to date/efficacy: Tylenol PM occasionally    Review of Systems     Objective:   Physical Exam  Gen. appearance: Well-nourished, in no distress Eyes: Extraocular motion intact, field of vision normal, vision grossly intact, no nystagmus Neck: Normal range of motion, no masses, normal thyroid Cardiovascular: Slow rate ; rhythm normal; no murmurs, gallops or extra heart sounds Muscle skeletal: Range of motion, tone, &  strength normal Neuro:no cranial nerve deficit, deep tendon  reflexes normal, gait normal Lymph: No cervical or axillary LA Skin: Warm and dry without suspicious lesions or rashes Psych: no anxiety or mood change. Normally interactive and cooperative.         Assessment & Plan:  #1 dyslipidemia; family history of premature coronary disease  (mother MI at 85). 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 Minor sleep hygiene issues (caffeine, watching TV)  Plan: #1 Crestor 20 mg one half daily  #2 zolpidem 10 mg one every third night as needed. If sleep becomes more progressive or severe issue sleep referral would be recommended.

## 2011-06-15 ENCOUNTER — Encounter: Payer: Self-pay | Admitting: Internal Medicine

## 2011-06-15 ENCOUNTER — Ambulatory Visit (INDEPENDENT_AMBULATORY_CARE_PROVIDER_SITE_OTHER): Payer: BC Managed Care – PPO | Admitting: Internal Medicine

## 2011-06-15 DIAGNOSIS — E785 Hyperlipidemia, unspecified: Secondary | ICD-10-CM

## 2011-06-15 DIAGNOSIS — Z0000 Encounter for general adult medical examination without abnormal findings: Secondary | ICD-10-CM

## 2011-06-15 LAB — HEPATIC FUNCTION PANEL
Alkaline Phosphatase: 69 U/L (ref 39–117)
Bilirubin, Direct: 0.1 mg/dL (ref 0.0–0.3)
Total Bilirubin: 0.8 mg/dL (ref 0.3–1.2)
Total Protein: 7.3 g/dL (ref 6.0–8.3)

## 2011-06-15 LAB — LIPID PANEL
HDL: 61.5 mg/dL (ref >39.00)
LDL Cholesterol: 104 mg/dL — ABNORMAL HIGH (ref 0–99)
Total CHOL/HDL Ratio: 3

## 2011-06-15 LAB — BASIC METABOLIC PANEL
BUN: 14 mg/dL (ref 6–23)
Calcium: 9.3 mg/dL (ref 8.4–10.5)
Creatinine, Ser: 0.8 mg/dL (ref 0.4–1.2)

## 2011-06-15 LAB — CBC WITH DIFFERENTIAL/PLATELET
Basophils Relative: 0.2 % (ref 0.0–3.0)
Eosinophils Absolute: 0.3 10*3/uL (ref 0.0–0.7)
Eosinophils Relative: 4.3 % (ref 0.0–5.0)
Lymphocytes Relative: 37.6 % (ref 12.0–46.0)
MCHC: 34.1 g/dL (ref 30.0–36.0)
Neutrophils Relative %: 52.6 % (ref 43.0–77.0)
Platelets: 288 10*3/uL (ref 150.0–400.0)
RBC: 4.53 Mil/uL (ref 3.87–5.11)
WBC: 6.9 10*3/uL (ref 4.5–10.5)

## 2011-06-15 LAB — TSH: TSH: 2.67 u[IU]/mL (ref 0.35–5.50)

## 2011-06-15 MED ORDER — ROSUVASTATIN CALCIUM 10 MG PO TABS: 10.0000 mg | ORAL_TABLET | Freq: Every day | ORAL | Status: DC

## 2011-06-15 MED ORDER — MOMETASONE FUROATE 50 MCG/ACT NA SUSP: 2.0000 | Freq: Every day | NASAL | Status: DC

## 2011-06-15 MED ORDER — ALBUTEROL SULFATE HFA 108 (90 BASE) MCG/ACT IN AERS: 2.0000 | INHALATION_SPRAY | RESPIRATORY_TRACT | Status: DC

## 2011-06-15 MED ORDER — MONTELUKAST SODIUM 10 MG PO TABS: 10.0000 mg | ORAL_TABLET | Freq: Every day | ORAL | Status: DC

## 2011-06-15 NOTE — Progress Notes (Signed)
Subjective:    Patient ID: Cindy Pittman, female    DOB: Jul 02, 1975, 36 y.o.   MRN: [id number]  HPI  Cindy Pittman  is here for a physical;acute issues include mild respiratory symptoms      Review of Systems Respiratory tract infection Onset/symptoms:sinusitis treated [facility] in late 11/12 , but head congestion in past 10 days Exposures (illness/environmental/extrinsic):no Progression of symptoms:stable Treatments/response:humidifier helps some Present symptoms: Fever/chills/sweats:no Frontal headache:ntermittently Facial pain:no Nasal purulence:scant yellow Sore throat:no Dental pain:no Lymphadenopathy:no Wheezing/shortness of breath:no; MDI used pre exercise with control Cough/sputum/hemoptysis:no Associated extrinsic/allergic symptoms:itchy eyes/ sneezing:no            Objective:   Physical Exam Gen.: thin , healthy and well-nourished in appearance. Alert, appropriate and cooperative throughout exam. Head: Normocephalic without obvious abnormalities  Eyes: No corneal or conjunctival inflammation noted. Pupils equal round reactive to light and accommodation. Fundal exam is benign without hemorrhages, exudate, papilledema. Extraocular motion intact. Vision grossly normal. Ears: External  ear exam reveals no significant lesions or deformities. Canals clear .TMs normal. Hearing is grossly normal bilaterally. Nose: External nasal exam reveals no deformity or inflammation. Nasal mucosa are pink and moist. No lesions or exudates noted. Septum  Not deviated  Mouth: Oral mucosa and oropharynx reveal no lesions or exudates. Teeth in good repair. Neck: No deformities, masses, or tenderness noted. Range of motion &. Thyroid normal. Lungs: Normal respiratory effort; chest expands symmetrically. Lungs are clear to auscultation without rales, wheezes, or increased work of breathing. Heart: slow rate and regular  rhythm. Normal S1; slight accentuation of S2. No gallop, click, or rub. No  murmur. Abdomen: Bowel sounds normal; abdomen soft and nontender. No masses, organomegaly or hernias noted.Aorta palpable ; no AAA Genitalia: Dr Vickey Sages   .                                                                                   Musculoskeletal/extremities: No deformity or scoliosis noted of  the thoracic or lumbar spine.Op scar LS spine. No clubbing, cyanosis, edema, or deformity noted. Range of motion  normal .Tone & strength  normal.Joints normal. Nail health  good. Vascular: Carotid, radial artery, dorsalis pedis and  posterior tibial pulses are full and equal. No bruits present. Neurologic: Alert and oriented x3. Deep tendon reflexes symmetrical and normal.          Skin: Intact without suspicious lesions or rashes. Lymph: No cervical, axillary lymphadenopathy present. Psych: Mood and affect are normal. Normally interactive                                                                                         Assessment & Plan:  #1 comprehensive physical exam; no acute findings  #2 upper respiratory tract symptoms without criteria for rhinosinusitis #3 see Problem List with Assessments & Recommendations Plan: see  Orders

## 2011-06-15 NOTE — Patient Instructions (Addendum)
Preventive Health Care: Exercise  30-45  minutes a day, 3-4 days a week. Walking is especially valuable in preventing Osteoporosis. Eat a low-fat diet with lots of fruits and vegetables, up to 7-9 servings per day. Consume less than 30 grams of sugar per day from foods & drinks with High Fructose Corn Syrup as #1,2,3 or #4 on label. Plain Mucinex for thick secretions ;force NON dairy fluids . Use a Neti pot daily as needed for sinus congestion . Nasonex 1 spray in each nostril twice a day as needed. Use the "crossover" technique as discussed

## 2011-08-01 ENCOUNTER — Other Ambulatory Visit: Payer: Self-pay | Admitting: Internal Medicine

## 2011-08-09 ENCOUNTER — Ambulatory Visit (INDEPENDENT_AMBULATORY_CARE_PROVIDER_SITE_OTHER): Payer: BC Managed Care – PPO | Admitting: Internal Medicine

## 2011-08-09 ENCOUNTER — Encounter: Payer: Self-pay | Admitting: Internal Medicine

## 2011-08-09 VITALS — BP 124/82 | HR 71 | Temp 99.2°F | Wt 164.2 lb

## 2011-08-09 DIAGNOSIS — J329 Chronic sinusitis, unspecified: Secondary | ICD-10-CM

## 2011-08-09 DIAGNOSIS — A499 Bacterial infection, unspecified: Secondary | ICD-10-CM

## 2011-08-09 DIAGNOSIS — B9689 Other specified bacterial agents as the cause of diseases classified elsewhere: Secondary | ICD-10-CM

## 2011-08-09 DIAGNOSIS — R0789 Other chest pain: Secondary | ICD-10-CM

## 2011-08-09 MED ORDER — AMOXICILLIN-POT CLAVULANATE 875-125 MG PO TABS: 1.0000 | ORAL_TABLET | Freq: Two times a day (BID) | ORAL | Status: AC

## 2011-08-09 NOTE — Patient Instructions (Addendum)
Plain Mucinex for thick secretions ;force NON dairy fluids . Use a Neti pot daily as needed for sinus congestion. Nasal cleansing in the shower as discussed. Make sure that all residual soap is removed to prevent irritation. Nasonex  1 spray in each nostril twice a day as needed. Use the "crossover" technique as discussed. Avoid positioning or activity which causes chest tightness. If symptoms persist or progress; imaging will be performed. Eat  4-6 smaller meals, avoiding overloading the stomach.

## 2011-08-09 NOTE — Progress Notes (Signed)
  Subjective:    Patient ID: Cindy Pittman, female    DOB: 1975/09/17, 36 y.o.   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 Progression of symptoms:to bloody purulent secretions constantly as of past week Treatments/response:Neti pot,Nasonex w/o benefit Present symptoms: Fever/chills/sweats:not definitely @ home Frontal headache:yes Facial pain:yes Nasal purulence:yellow - green Sore throat:no Dental pain:no Lymphadenopathy:no Wheezing/shortness of breath:no but see below Cough/sputum/hemoptysis:no Pleuritic pain:no Associated extrinsic/allergic symptoms:itchy eyes/ sneezing:minor sneezing Past medical history: Seasonal allergies:no /asthma:EIB only Smoking history:never           Review of Systems For 1 month she's noted some chest tightness which has been somewhat positional. It occurs when she sitting up or with waist flexion such as putting on her shoes. She denies any exertional chest tightness, palpitations, or claudication. Has had no paroxysmal nocturnal dyspnea or edema. She denies any significant dyspepsia, dysphagia, unexplained weight loss, abdominal pain, melena or rectal bleeding.     Objective:   Physical Exam General appearance:thin but in good health ;well nourished; no acute distress or increased work of breathing is present.  No  lymphadenopathy about the head, neck, or axilla noted.   Eyes: No conjunctival inflammation or lid edema is present. There is no scleral icterus.  Ears:  External ear exam shows no significant lesions or deformities.  Otoscopic examination reveals clear canals, tympanic membranes are intact bilaterally without bulging, retraction, inflammation or discharge.  Nose:  External nasal examination shows no deformity or inflammation. Nasal mucosa are pink and moist without lesions or exudates. No septal dislocation  or deviation.No obstruction to airflow.   Oral exam: Dental hygiene is good; lips and gums are healthy appearing.There is no oropharyngeal erythema or exudate noted.   Neck:  No deformities, thyromegaly, masses, or tenderness noted.   Supple with full range of motion without pain.   Heart:  Normal rate and regular rhythm. S1 and S2 normal without gallop, murmur, click, rub S 4 Lungs:Chest clear to auscultation; no wheezes, rhonchi,rales ,or rubs present.No increased work of breathing.    Extremities:  No cyanosis, edema, or clubbing  noted . Homans sign is negative bilaterally   Skin: Warm & dry w/o jaundice or tenting.          Assessment & Plan:  #1 protracted rhinosinusitis with bloody, purulent nasal secretions  #2 positional chest tightness; this is most likely related to a hiatal hernia aggravated by sitting or flexing at the waist. EKG is normal  Plan: See orders and recommendations

## 2011-08-23 ENCOUNTER — Telehealth: Payer: Self-pay

## 2011-08-23 NOTE — Telephone Encounter (Signed)
Message received by C-A-N stating patient still having mid-chest pain worsened with movement. Also L Calf pain, burning or throbbing sensation x 3 days. Patient was seen recently on 08/09/2011. C-A-N advice stated: Advised see in 24 hours per CP/LEG Non injury Protocols. No appts are available. Pt is advised to call back in the AM to see if the on call MD has appts open.  Per Dr.Hopper contact patient for appointment, have her come in tomorrow afternoon. Patient to have CXray and D-Dimer at [facility] in the AM. ER/Urgent Care if any change in symptoms  Patient aware to go to [facility] for labs and XRay, appointment scheduled for here at 1:15 pm. Patient verbally agreed to go to ER or Urgent Care if any change in symptoms

## 2011-08-24 ENCOUNTER — Other Ambulatory Visit: Payer: BC Managed Care – PPO

## 2011-08-24 ENCOUNTER — Ambulatory Visit (INDEPENDENT_AMBULATORY_CARE_PROVIDER_SITE_OTHER): Payer: BC Managed Care – PPO | Admitting: Internal Medicine

## 2011-08-24 ENCOUNTER — Encounter: Payer: Self-pay | Admitting: Internal Medicine

## 2011-08-24 ENCOUNTER — Telehealth: Payer: Self-pay | Admitting: Internal Medicine

## 2011-08-24 ENCOUNTER — Ambulatory Visit (INDEPENDENT_AMBULATORY_CARE_PROVIDER_SITE_OTHER)
Admission: RE | Admit: 2011-08-24 | Discharge: 2011-08-24 | Disposition: A | Discharge status: Home / Self Care | Payer: BC Managed Care – PPO | Source: Ambulatory Visit | Attending: Internal Medicine | Admitting: Internal Medicine

## 2011-08-24 VITALS — BP 120/82 | HR 56 | Temp 98.6°F | Wt 164.0 lb

## 2011-08-24 DIAGNOSIS — R0789 Other chest pain: Secondary | ICD-10-CM

## 2011-08-24 DIAGNOSIS — M79609 Pain in unspecified limb: Secondary | ICD-10-CM

## 2011-08-24 DIAGNOSIS — Z8249 Family history of ischemic heart disease and other diseases of the circulatory system: Secondary | ICD-10-CM

## 2011-08-24 DIAGNOSIS — R079 Chest pain, unspecified: Secondary | ICD-10-CM

## 2011-08-24 DIAGNOSIS — M79669 Pain in unspecified lower leg: Secondary | ICD-10-CM

## 2011-08-24 MED ORDER — OMEPRAZOLE 20 MG PO CPDR: DELAYED_RELEASE_CAPSULE | ORAL | Status: DC

## 2011-08-24 MED ORDER — TRAMADOL HCL 50 MG PO TABS: 50.0000 mg | ORAL_TABLET | Freq: Four times a day (QID) | ORAL | Status: AC | PRN

## 2011-08-24 NOTE — Progress Notes (Signed)
Subjective:    Patient ID: Cindy Pittman, female    DOB: 1976/05/07, 36 y.o.   MRN: [id number]  HPI The chest pain for which she was seen 08/09/11 has persisted and has become more frequent. This pain is described as substernal in location and dull to sharp without radiation. It is not associated with nausea or diaphoresis. It was exacerbated when she reached to grasp a package she dropped. It has not been brought on by exertion but she's been fairly sedentary because of the present illness.  She denies any pleuritic type component to the pain. She sneezed today and did have a small amount of yellow sputum. She denies hemoptysis or shortness of breath.  The symptoms suggestive of rhinosinusitis have resolved. Additionally she noted aching to burning in the left lower extremity in the calf as well as the mid shin. This is not aggravated by walking. It is described as a 3-5/10 in severity;she has  also noted some altered sensation in her foot described as "different " from the RLE.  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 These symptoms are unlike the symptoms associated with that.  There is no personal or family history of deep venous thrombosis or pulmonary thromboemboli.    Review of Systems  She denies dyspepsia, dysphagia, abdominal pain, rectal bleeding or melena.  She does take baby aspirin but denies intake of nonsteroidals     Objective:   Physical Exam Gen.: Healthy and well-nourished in appearance. Alert, appropriate and cooperative throughout exam.  Ears: External  ear exam reveals no significant lesions or deformities. Canals clear .TMs normal. Hearing is grossly normal bilaterally.  Mouth: Oral mucosa and oropharynx reveal no lesions or exudates. Teeth in good repair. Neck: No deformities, masses, or tenderness noted.  Lungs: Normal respiratory effort; chest expands symmetrically. Lungs are clear to auscultation without rales, wheezes, or increased  work of breathing.  Chest: There is no pain  to deep palpation over the costochondral margins Heart: Normal rate and rhythm. Normal S1 and S2. No gallop, click, or rub. No murmur. Abdomen: Bowel sounds normal; abdomen soft and nontender. No masses, organomegaly or hernias noted. Aorta is palpable but not enlarged                                                                      Musculoskeletal/extremities:No clubbing, cyanosis, edema, or deformity noted. Homans sign appears to be negative  Vascular: Carotid, radial artery, dorsalis pedis and  posterior tibial pulses are full and equal. No bruits present. Neurologic: Alert and oriented x3.  Skin: Intact without suspicious lesions or rashes. Lymph: No cervical, axillary lymphadenopathy present. Psych: Mood and affect are normal. Normally interactive                                                                                         Assessment & Plan:  #1 chest pain,  atypical, substernal. Chest x-ray and EKG are normal.  #2 left calf and shin discomfort without clinical deep venous thrombosis. D-dimer pending.  Clinically the thrombosis and pulmonary thromboemboli are not present but must be considered. The substernal location suggests possible esophageal spasm. Costochondritis is not clinically present.  Plan: Await d-dimer. If it is equivocal,  CT angiogram would be necessary. Venous Doppler will be performed.  Esophageal spasm possibility  will be treated with PPIs

## 2011-08-24 NOTE — Telephone Encounter (Signed)
Patient has an appointment this afternoon-SS  [facility] Triage Call Report Triage Record Num: [id number] Operator: Freddie Breech Patient Name: Cindy Pittman Call Date & Time: 08/23/2011 3:37:02PM Patient Phone: PCP: Patient Gender: Female PCP Fax : Patient DOB: [birth year] Practice Name: [facility] Reason for Call: Caller: [name]; PCP: Marga Melnick; CB#: [phone]; ; Call regarding ? Hiatal Hernia; LMP 06/07/11 OV on 08/09/11. Pt had chest pain at her OV that was dx'd as a ? hiatal hernia. She is still having mid chest pain worsened with movement. Also L calf pain, a burning or throbbing sensation x 3 d. Advised see in 24 hrs per CP/Leg Noninjury Protocols. No appts are available. Pt is advised to call back in the AM to see if the on call MD has appts open. Protocol(s) Used: Chest Pain Recommended Outcome per Protocol: See Provider within 72 Hours Reason for Outcome: All other situations Care Advice: Talk to provider immediately if having repeated episodes pain/discomfort in lower chest or back; indigestion not relieved with antacids; shortness of breath without chest pain; or unexplained fatigue, weakness or anxiety. ~ Heartburn Relief (Dietary): - Avoid overeating. - Eat smaller, more frequent meals and chew each bite thoroughly. - Avoid high fat, spicy or gas-producing foods. - Limit liquids/foods that are gastric irritants (fruit juices, caffeinated, carbonated or alcoholic beverages, chocolate and dairy products). - Eat at least three hours before bedtime. ~ Heartburn Relief (Positioning): - Avoid bending over at the waist or lying flat soon after eating. - Avoid clothing that is tight around the abdomen and waist. - Sleeping on stacked pillows or raising the head of bed on 6 inch blocks may help prevent reflux

## 2011-08-24 NOTE — Telephone Encounter (Signed)
Duplicate message, Note: name in call a nurse message is incorrect

## 2011-08-24 NOTE — Patient Instructions (Signed)
The triggers for dyspepsia or "heart burn"  include stress; the "aspirin family" ; alcohol; peppermint; and caffeine (coffee, tea, cola, and chocolate). The aspirin family would include aspirin and the nonsteroidal agents such as ibuprofen &  Naproxen. Tylenol would not cause reflux. If having dyspepsia ; food & drink should be avoided for @ least 2 hours before going to bed. Stop baby ASA now

## 2011-08-25 ENCOUNTER — Ambulatory Visit: Payer: BC Managed Care – PPO | Admitting: Internal Medicine

## 2011-08-25 LAB — CK TOTAL AND CKMB (NOT AT ARMC): CK, MB: 0.5 ng/mL (ref 0.3–4.0)

## 2011-09-04 ENCOUNTER — Encounter: Payer: Self-pay | Admitting: Internal Medicine

## 2011-09-05 ENCOUNTER — Ambulatory Visit (INDEPENDENT_AMBULATORY_CARE_PROVIDER_SITE_OTHER): Payer: BC Managed Care – PPO | Admitting: Internal Medicine

## 2011-09-05 ENCOUNTER — Encounter: Payer: Self-pay | Admitting: Internal Medicine

## 2011-09-05 VITALS — BP 114/68 | HR 50 | Temp 98.3°F | Wt 165.0 lb

## 2011-09-05 DIAGNOSIS — J019 Acute sinusitis, unspecified: Secondary | ICD-10-CM

## 2011-09-05 MED ORDER — CEFUROXIME AXETIL 500 MG PO TABS: 500.0000 mg | ORAL_TABLET | Freq: Two times a day (BID) | ORAL | Status: AC

## 2011-09-05 NOTE — Patient Instructions (Signed)
Plain Mucinex for thick secretions ;force NON dairy fluids . Use a Neti pot daily as needed for sinus congestion. Nasal cleansing in the shower as discussed. Make sure that all residual soap is removed to prevent irritation.  

## 2011-09-05 NOTE — Progress Notes (Signed)
  Subjective:    Patient ID: Cindy Pittman, female    DOB: 1976-05-26, 36 y.o.   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 Progression of symptoms:to frontal headache Treatments/response:Tylenol ; nasal cleansing Present symptoms: Fever/chills/sweats:no  Facial pain:yes but , less than head Nasal purulence:yellow Sore throat:no Dental pain:no Lymphadenopathy:no Wheezing/shortness of breath:no Cough/sputum/hemoptysis:no Associated extrinsic/allergic symptoms:itchy eyes/ sneezing:no Past medical history: Seasonal allergies: no/asthma:EIB Smoking history:never            Review of Systems     Objective:   Physical Exam General appearance:good health ;well nourished; no acute distress or increased work of breathing is present.  No  lymphadenopathy about the head, neck, or axilla noted.   Eyes: No conjunctival inflammation or lid edema is present.   Ears:  External ear exam shows no significant lesions or deformities.  Otoscopic examination reveals clear canals, tympanic membranes are intact bilaterally without bulging, retraction, inflammation or discharge.  Nose:  External nasal examination shows no deformity or inflammation. Nasal mucosa are dry without lesions or exudates. No septal dislocation or deviation.No obstruction to airflow.   Oral exam: Dental hygiene is good; lips and gums are healthy appearing.There is no oropharyngeal erythema or exudate noted.      Heart:  Normal rate and regular rhythm. S1 and S2 normal without gallop, murmur, click, rub .S 4 Lungs:Chest clear to auscultation; no wheezes, rhonchi,rales ,or rubs present.No increased work of breathing.    Extremities:  No cyanosis, edema, or clubbing  noted    Skin: Warm & dry          Assessment & Plan:  #1 sinusitis Plan: See orders and recommendations

## 2011-09-26 ENCOUNTER — Other Ambulatory Visit (INDEPENDENT_AMBULATORY_CARE_PROVIDER_SITE_OTHER): Payer: BC Managed Care – PPO

## 2011-09-26 DIAGNOSIS — T887XXA Unspecified adverse effect of drug or medicament, initial encounter: Secondary | ICD-10-CM

## 2011-09-26 DIAGNOSIS — Z8249 Family history of ischemic heart disease and other diseases of the circulatory system: Secondary | ICD-10-CM

## 2011-09-26 DIAGNOSIS — E785 Hyperlipidemia, unspecified: Secondary | ICD-10-CM

## 2011-09-26 LAB — LIPID PANEL
Cholesterol: 164 mg/dL (ref 0–200)
LDL Cholesterol: 92 mg/dL (ref 0–99)
Total CHOL/HDL Ratio: 3
VLDL: 19 mg/dL (ref 0.0–40.0)

## 2011-09-26 LAB — HEPATIC FUNCTION PANEL
Albumin: 4.1 g/dL (ref 3.5–5.2)
Alkaline Phosphatase: 61 U/L (ref 39–117)
Bilirubin, Direct: 0 mg/dL (ref 0.0–0.3)
Total Protein: 7.2 g/dL (ref 6.0–8.3)

## 2012-06-15 ENCOUNTER — Encounter: Payer: Self-pay | Admitting: Lab

## 2012-06-18 ENCOUNTER — Encounter: Payer: Self-pay | Admitting: Internal Medicine

## 2012-06-18 ENCOUNTER — Ambulatory Visit (INDEPENDENT_AMBULATORY_CARE_PROVIDER_SITE_OTHER): Payer: BC Managed Care – PPO | Admitting: Internal Medicine

## 2012-06-18 VITALS — BP 110/70 | HR 66 | Temp 97.3°F | Resp 12 | Ht 66.03 in | Wt 171.0 lb

## 2012-06-18 DIAGNOSIS — E785 Hyperlipidemia, unspecified: Secondary | ICD-10-CM

## 2012-06-18 DIAGNOSIS — J45990 Exercise induced bronchospasm: Secondary | ICD-10-CM

## 2012-06-18 DIAGNOSIS — Z23 Encounter for immunization: Secondary | ICD-10-CM

## 2012-06-18 DIAGNOSIS — Z0000 Encounter for general adult medical examination without abnormal findings: Secondary | ICD-10-CM

## 2012-06-18 LAB — BASIC METABOLIC PANEL
Chloride: 107 mEq/L (ref 96–112)
GFR: 68.62 mL/min (ref >60.00)
Potassium: 4.3 mEq/L (ref 3.5–5.1)
Sodium: 140 mEq/L (ref 135–145)

## 2012-06-18 LAB — CBC WITH DIFFERENTIAL/PLATELET
Basophils Relative: 0.1 % (ref 0.0–3.0)
Eosinophils Relative: 3 % (ref 0.0–5.0)
HCT: 40.2 % (ref 36.0–46.0)
Hemoglobin: 13.8 g/dL (ref 12.0–15.0)
Lymphs Abs: 2.6 10*3/uL (ref 0.7–4.0)
MCV: 90.5 fl (ref 78.0–100.0)
Monocytes Absolute: 0.4 10*3/uL (ref 0.1–1.0)
Monocytes Relative: 5.3 % (ref 3.0–12.0)
Neutro Abs: 4.9 10*3/uL (ref 1.4–7.7)
WBC: 8.2 10*3/uL (ref 4.5–10.5)

## 2012-06-18 LAB — LIPID PANEL
Cholesterol: 113 mg/dL (ref 0–200)
LDL Cholesterol: 50 mg/dL (ref 0–99)

## 2012-06-18 LAB — HEPATIC FUNCTION PANEL
ALT: 21 U/L (ref 0–35)
AST: 17 U/L (ref 0–37)
Albumin: 3.9 g/dL (ref 3.5–5.2)
Total Protein: 7.2 g/dL (ref 6.0–8.3)

## 2012-06-18 LAB — TSH: TSH: 2.77 u[IU]/mL (ref 0.35–5.50)

## 2012-06-18 MED ORDER — ALBUTEROL SULFATE HFA 108 (90 BASE) MCG/ACT IN AERS: 2.0000 | INHALATION_SPRAY | RESPIRATORY_TRACT | Status: DC

## 2012-06-18 MED ORDER — MONTELUKAST SODIUM 10 MG PO TABS: ORAL_TABLET | ORAL | Status: DC

## 2012-06-18 NOTE — Patient Instructions (Addendum)
Preventive Health Care: Exercise  30-45  minutes a day, 3-4 days a week. Walking is especially valuable in preventing Osteoporosis. Eat a low-fat diet with lots of fruits and vegetables, up to 7-9 servings per day.  Consume less than 30 grams (preferably ZERO) of sugar per day from foods & drinks with High Fructose Corn Syrup as #1,2,3 or #4 on label. [employer], [employer] & [employer] do not carry products with HFCS. Review and correct the record as indicated. Please share record with all medical staff seen.

## 2012-06-18 NOTE — Progress Notes (Signed)
  Subjective:    Patient ID: Cindy Pittman, female    DOB: 1976/01/14, 37 y.o.   MRN: [id number]  HPI  Cindy Pittman is here for a physical; she denies active or acute issues .     Review of Systems She is on a "typical American diet";she exercises 30 minutes 2 times per week without symptoms. Specifically she denies chest pain, palpitations, dyspnea, or claudication.Albuterolpre exercise prevents EIB.  Family history is positive for premature coronary disease. Advanced cholesterol testing reveals her ideal LDL goal is less than 70 .      Objective:   Physical Exam Gen.:  well-nourished in appearance. Alert, appropriate and cooperative throughout exam. Head: Normocephalic without obvious abnormalities  Eyes: No corneal or conjunctival inflammation noted. Pupils equal round reactive to light and accommodation. Fundal exam is benign without hemorrhages, exudate, papilledema. Extraocular motion intact. Vision grossly normal. Ears: External  ear exam reveals no significant lesions or deformities. Canals clear .TMs normal. Hearing is grossly normal bilaterally. Nose: External nasal exam reveals no deformity or inflammation. Nasal mucosa are pink and moist. No lesions or exudates noted.   Mouth: Oral mucosa and oropharynx reveal no lesions or exudates. Teeth in good repair. Neck: No deformities, masses, or tenderness noted. Range of motion decreased. Thyroid normal. Lungs: Normal respiratory effort; chest expands symmetrically. Lungs are clear to auscultation without rales, wheezes, or increased work of breathing. Heart: Normal rate and rhythm. Normal S1 and S2. No gallop, click, or rub. Grade 1/2 over 6 systolic murmur . Abdomen: Bowel sounds normal; abdomen soft and nontender. No masses, organomegaly or hernias noted. Genitalia: Dr Vickey Sages, [facility]                                                                          Musculoskeletal/extremities: No deformity or scoliosis noted of  the thoracic or  lumbar spine. Op scar LS spine.No clubbing, cyanosis, edema, or deformity noted. Range of motion  normal .Tone & strength  normal.Joints normal. Nail health  good. Vascular: Carotid, radial artery, dorsalis pedis and  posterior tibial pulses are full and equal. No bruits present. Neurologic: Alert and oriented x3. Deep tendon reflexes symmetrical and normal.          Skin: Intact without suspicious lesions or rashes. Lymph: No cervical, axillary lymphadenopathy present. Psych: Mood and affect are normal. Normally interactive                                                                                      Assessment & Plan:  #1 comprehensive physical exam; no acute findings  Plan: see Orders

## 2012-06-21 ENCOUNTER — Other Ambulatory Visit: Payer: Self-pay

## 2012-06-21 ENCOUNTER — Encounter: Payer: Self-pay | Admitting: Internal Medicine

## 2012-06-21 MED ORDER — ROSUVASTATIN CALCIUM 10 MG PO TABS: ORAL_TABLET | ORAL | Status: DC

## 2012-07-06 ENCOUNTER — Encounter: Payer: Self-pay | Admitting: Internal Medicine

## 2012-08-13 ENCOUNTER — Ambulatory Visit: Payer: BC Managed Care – PPO | Admitting: Internal Medicine

## 2012-08-14 ENCOUNTER — Ambulatory Visit (INDEPENDENT_AMBULATORY_CARE_PROVIDER_SITE_OTHER): Payer: BC Managed Care – PPO | Admitting: Internal Medicine

## 2012-08-14 ENCOUNTER — Encounter: Payer: Self-pay | Admitting: Internal Medicine

## 2012-08-14 VITALS — BP 126/80 | HR 64 | Temp 97.9°F | Wt 174.0 lb

## 2012-08-14 DIAGNOSIS — J0100 Acute maxillary sinusitis, unspecified: Secondary | ICD-10-CM

## 2012-08-14 MED ORDER — CEFUROXIME AXETIL 500 MG PO TABS: 500.0000 mg | ORAL_TABLET | Freq: Two times a day (BID) | ORAL | Status: DC

## 2012-08-14 NOTE — Patient Instructions (Addendum)
Plain Mucinex (NOT D) for thick secretions ;force NON dairy fluids .   Nasal cleansing in the shower as discussed with lather of mild shampoo.After 10 seconds wash off lather while  exhaling through nostrils. Make sure that all residual soap is removed to prevent irritation.  Nasonex 1 spray in each nostril twice a day as needed. Use the "crossover" technique into opposite nostril spraying toward opposite ear @ 45 degree angle, not straight up into nostril.  Use a Neti pot daily only  as needed for significant sinus congestion; going from open side to congested side . Plain Allegra (NOT D )  160 daily , Loratidine 10 mg , OR Zyrtec 10 mg @ bedtime  as needed for itchy eyes & sneezing.    

## 2012-08-14 NOTE — Progress Notes (Signed)
  Subjective:    Patient ID: Cindy Pittman, female    DOB: 07-04-1975, 37 y.o.   MRN: [id number]  HPI   The respiratory tract symptoms began as chest congestion and nonproductive cough around 07/22/2012. She went to the urgent care on 2/12, and was treated with a Z-pack and prednisone, which helped chest congestion and cough, but after that, the sinus congestion and purulence worsened.  Significant active  associated symptoms include frontal headache, facial pain, nasal purulence with yellow discharge and blood. Treatment with Mucinex, Dayquil, was not effective. 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 The patient had never smoked.    Flu shot is current.               Review of Systems  Symptoms not present include dental pain, sore throat, earache, &  otic discharge .Fever, chills, sweats were not present. Itchy/  watery eyes &  sneezing were not noted. Myalgias and arthralgias were not present.     Objective:   Physical Exam General appearance: thin but in ood health ;well nourished; no acute distress or increased work of breathing is present.   Eyes: No conjunctival inflammation or lid edema is present. EOMI normal  Ears:  External ear exam shows no significant lesions or deformities.  Otoscopic examination reveals clear canals, tympanic membranes are intact bilaterally without bulging, retraction, inflammation or discharge. Nose:  External nasal examination shows no deformity or inflammation. Nasal mucosa are pink and moist without lesions or exudates. No septal dislocation or deviation.No obstruction to airflow.  Oral exam: Dental hygiene is good; lips and gums are healthy appearing.There is no oropharyngeal erythema or exudate noted.  Neck:  No deformities, masses, or tenderness noted.    Heart:  Normal rate and regular rhythm. S1 and S2 normal without gallop, click, rub or murmur.   Lungs:Chest clear to auscultation; no wheezes, rhonchi,rales ,or rubs  present.No increased work of breathing.   Extremities:  No cyanosis, edema, or clubbing  noted  No  lymphadenopathy about the head, neck, or axilla noted.  Skin: Warm & dry            Assessment & Plan:  #1 rhinosinusitis without significant bronchitis  Plan: Nasal hygiene interventions discussed. See prescription medications

## 2013-04-18 ENCOUNTER — Other Ambulatory Visit: Payer: Self-pay

## 2014-01-09 IMAGING — CR DG CHEST 2V
2 series · 2 of 2 positions shown · non-contrast
Comparison: None.

CLINICAL DATA: Chest pain.

CHEST - 2 VIEW

[view not recorded (1 of 2)]
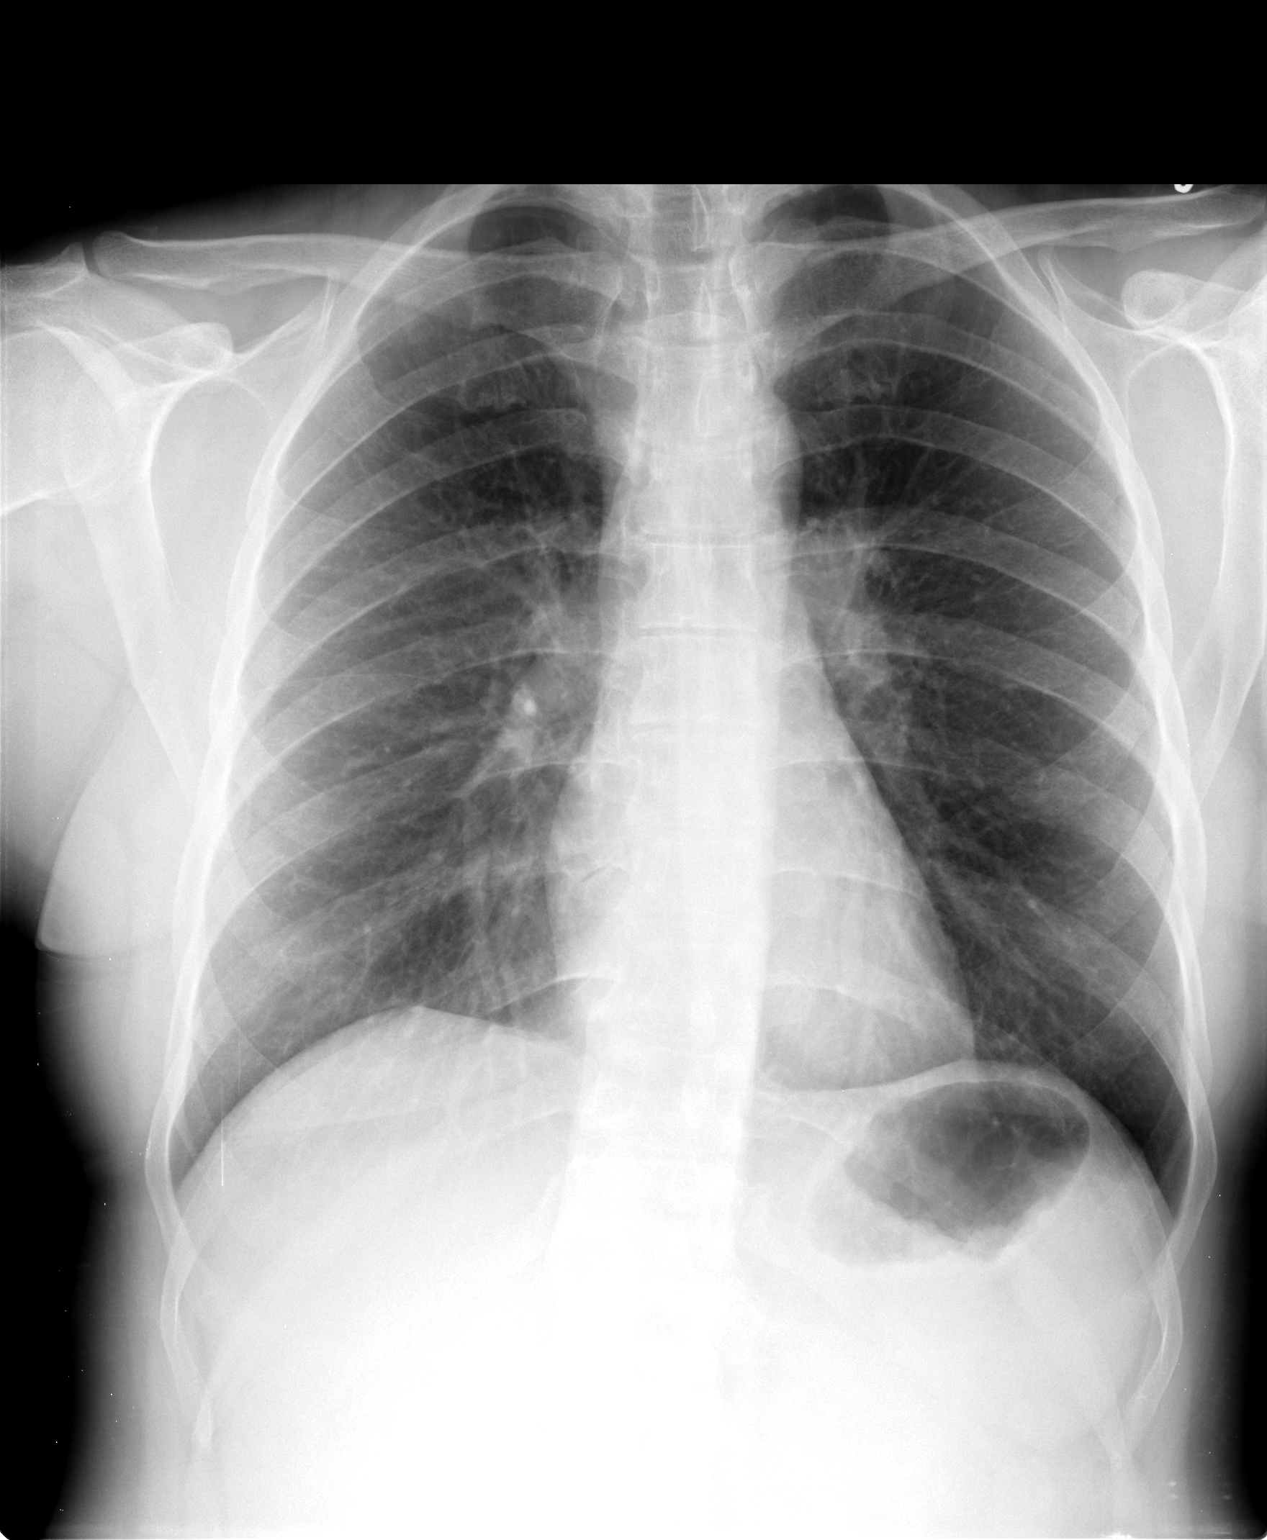

[view not recorded (2 of 2)]
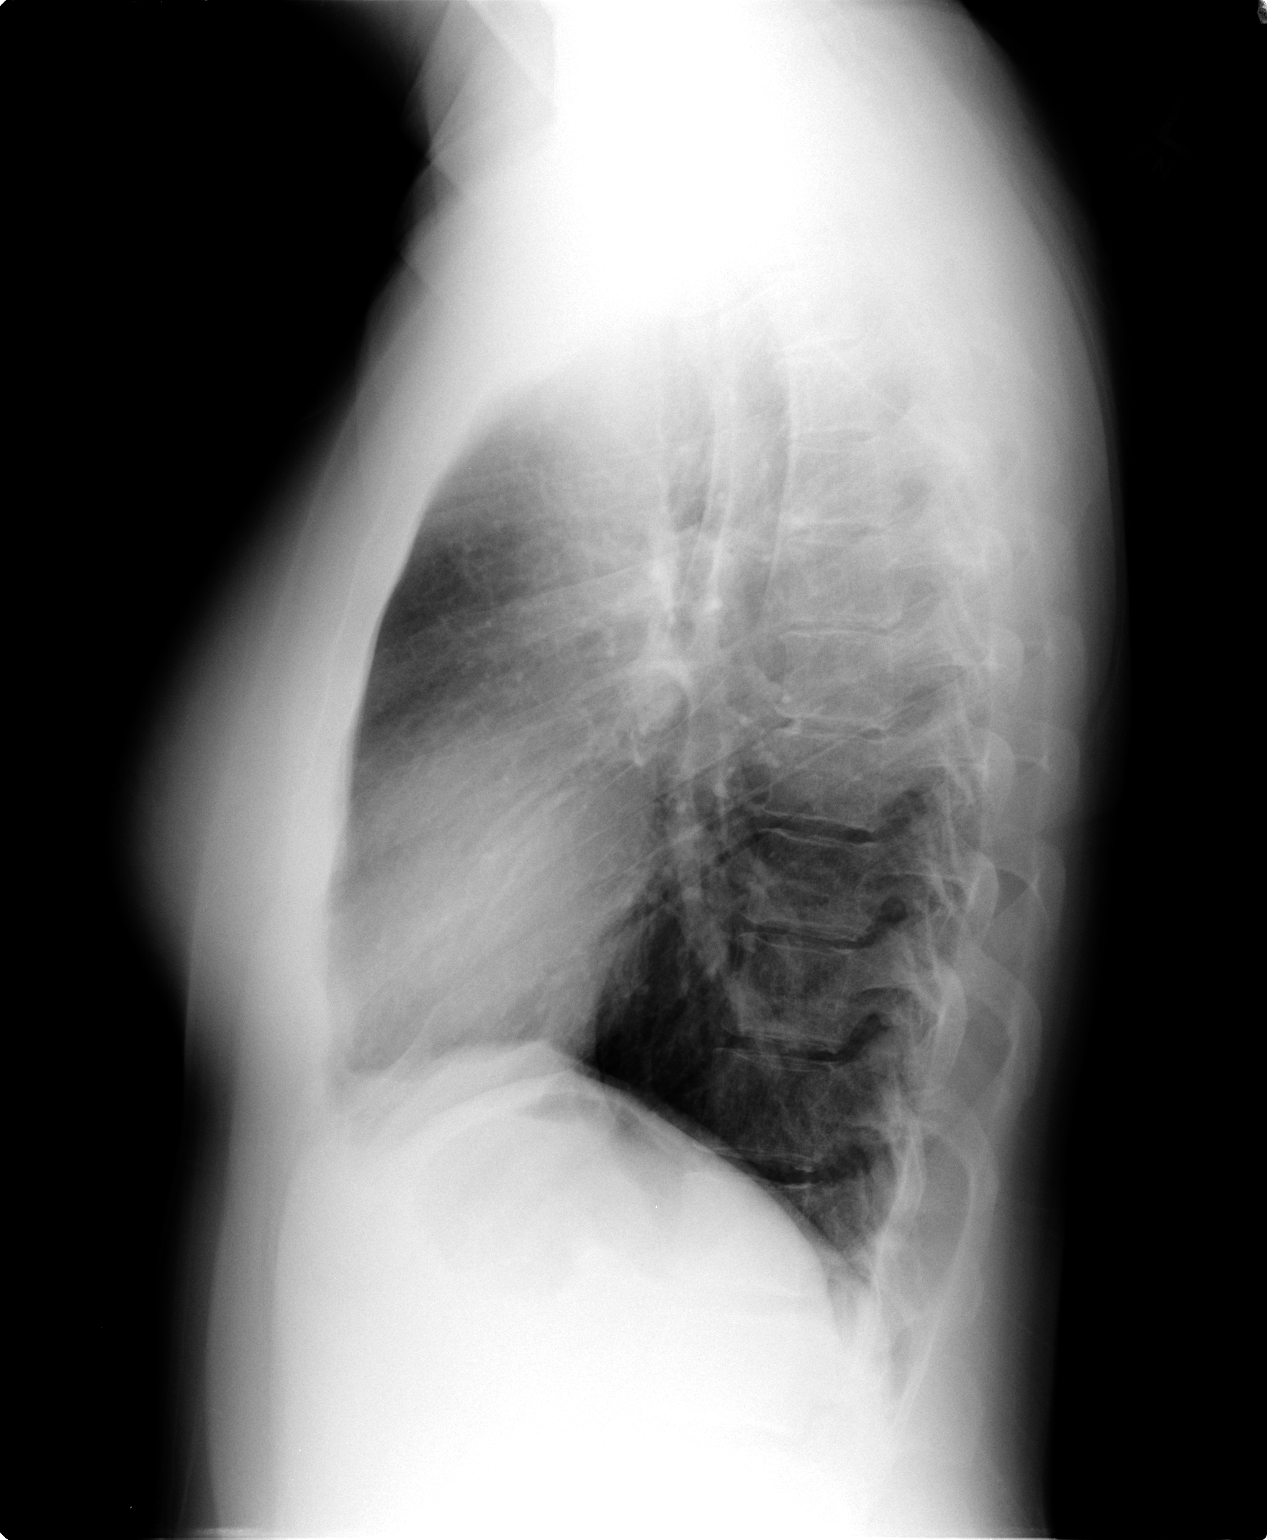

[2 of 2 positions shown; findings below may reference images not displayed]

FINDINGS: Heart and mediastinal contours are within normal limits.
No focal opacities or effusions.  No acute bony abnormality.
IMPRESSION: No active cardiopulmonary disease.

## 2018-09-12 ENCOUNTER — Telehealth: Payer: BC Managed Care – PPO | Admitting: Nurse Practitioner

## 2018-09-12 DIAGNOSIS — J00 Acute nasopharyngitis [common cold]: Secondary | ICD-10-CM

## 2018-09-12 NOTE — Progress Notes (Signed)
E-Visit for Corona Virus Screening  Based on your current symptoms, it seems unlikely that your symptoms are related to the Coronavirus.    * even though normal temp runs low you still are not considered to have temperature.  Coronavirus disease 2019 (COVID-19) is a respiratory illness that can spread from person to person. The virus that causes COVID-19 is a new virus that was first identified in the country of [place] but is now found in multiple other countries and has spread to the [place].  Symptoms associated with the virus are mild to severe fever, cough, and shortness of breath. There is currently no vaccine to protect against COVID-19, and there is no specific antiviral treatment for the virus.   To be considered HIGH RISK for Coronavirus (COVID-19), you have to meet the following criteria:  . Traveled to [place], [place], [place], [place] or [place]; or in the [place] to [place], [place], [place], or [place]; and have fever, cough, and shortness of breath within the last 2 weeks of travel OR  . Been in close contact with a person diagnosed with COVID-19 within the last 2 weeks and have fever, cough, and shortness of breath  . IF YOU DO NOT MEET THESE CRITERIA, YOU ARE CONSIDERED LOW RISK FOR COVID-19.   It is vitally important that if you feel that you have an infection such as this virus or any other virus that you stay home and away from places where you may spread it to others.  You should self-quarantine for 14 days if you have symptoms that could potentially be coronavirus and avoid contact with people age 8 and older.   You can use medication such as delsym or mucinex for cough.  You may also take acetaminophen (Tylenol) as needed for fever.   Reduce your risk of any infection by using the same precautions used for avoiding the common cold or flu:  Marland Kitchen Wash your hands often with soap and warm water for at least 20 seconds.  If soap and water are not readily  available, use an alcohol-based hand sanitizer with at least 60% alcohol.  . If coughing or sneezing, cover your mouth and nose by coughing or sneezing into the elbow areas of your shirt or coat, into a tissue or into your sleeve (not your hands). . Avoid shaking hands with others and consider head nods or verbal greetings only. . Avoid touching your eyes, nose, or mouth with unwashed hands.  . Avoid close contact with people who are sick. . Avoid places or events with large numbers of people in one location, like concerts or sporting events. . Carefully consider travel plans you have or are making. . If you are planning any travel outside or inside the [place], visit the CDC's Travelers' Health webpage for the latest health notices. . If you have some symptoms but not all symptoms, continue to monitor at home and seek medical attention if your symptoms worsen. . If you are having a medical emergency, call 911.  HOME CARE . Only take medications as instructed by your medical team. . Drink plenty of fluids and get plenty of rest. . A steam or ultrasonic humidifier can help if you have congestion.   GET HELP RIGHT AWAY IF: . You develop worsening fever. . You become short of breath . You cough up blood. . Your symptoms become more severe MAKE SURE YOU   Understand these instructions.  Will watch your condition.  Will get  help right away if you are not doing well or get worse.  Your e-visit answers were reviewed by a board certified advanced clinical practitioner to complete your personal care plan.  Depending on the condition, your plan could have included both over the counter or prescription medications.  If there is a problem please reply once you have received a response from your provider. Your safety is important to [place].  If you have drug allergies check your prescription carefully.    You can use MyChart to ask questions about today's visit, request a non-urgent call back, or ask for a  work or school excuse for 24 hours related to this e-Visit. If it has been greater than 24 hours you will need to follow up with your provider, or enter a new e-Visit to address those concerns. You will get an e-mail in the next two days asking about your experience.  I hope that your e-visit has been valuable and will speed your recovery. Thank you for using e-visits.   5 minutes spent reviewing and documenting in chart.

## 2018-09-17 ENCOUNTER — Ambulatory Visit: Payer: BC Managed Care – PPO | Admitting: Family Medicine

## 2018-10-12 ENCOUNTER — Ambulatory Visit (INDEPENDENT_AMBULATORY_CARE_PROVIDER_SITE_OTHER): Payer: BC Managed Care – PPO | Admitting: Family Medicine

## 2018-10-12 ENCOUNTER — Encounter: Payer: Self-pay | Admitting: Family Medicine

## 2018-10-12 VITALS — Ht 66.5 in | Wt 178.0 lb

## 2018-10-12 DIAGNOSIS — J45990 Exercise induced bronchospasm: Secondary | ICD-10-CM | POA: Diagnosis not present

## 2018-10-12 DIAGNOSIS — R05 Cough: Secondary | ICD-10-CM | POA: Diagnosis not present

## 2018-10-12 DIAGNOSIS — E282 Polycystic ovarian syndrome: Secondary | ICD-10-CM | POA: Diagnosis not present

## 2018-10-12 DIAGNOSIS — Z87442 Personal history of urinary calculi: Secondary | ICD-10-CM | POA: Insufficient documentation

## 2018-10-12 DIAGNOSIS — R059 Cough, unspecified: Secondary | ICD-10-CM

## 2018-10-12 DIAGNOSIS — E785 Hyperlipidemia, unspecified: Secondary | ICD-10-CM | POA: Diagnosis not present

## 2018-10-12 MED ORDER — BUDESONIDE-FORMOTEROL FUMARATE 80-4.5 MCG/ACT IN AERO: 2.0000 | INHALATION_SPRAY | Freq: Two times a day (BID) | RESPIRATORY_TRACT | 1 refills | Status: DC

## 2018-10-12 MED ORDER — AZELASTINE HCL 0.1 % NA SOLN
1.0000 | Freq: Two times a day (BID) | NASAL | 0 refills | Status: DC
Start: 2018-10-12 — End: 2018-12-11

## 2018-10-12 NOTE — Assessment & Plan Note (Signed)
Stable.  Continue albuterol.  Will start Symbicort as noted above.

## 2018-10-12 NOTE — Assessment & Plan Note (Signed)
Continue lifestyle modifications.  Patient will follow-up soon for CPE with blood work.

## 2018-10-12 NOTE — Progress Notes (Signed)
Chief Complaint:  Cindy Pittman is a 43 y.o. female who presents today for a virtual office visit with a chief complaint of cough and to establish care.   Assessment/Plan:  Cough Possibly due to underlying reactive airway disease.  We will start Symbicort given her history of exercise-induced asthma.  She also has some rhinorrhea playing a role-start Astelin nasal spray.  If symptoms persist for another 1 to 2 weeks, will need chest x-ray to rule out other possible causes.  Discussed reasons to return to care and seek emergent care.  PCOS (polycystic ovarian syndrome) Stable.  Continue management per gynecology.  Exercise induced bronchospasm Stable.  Continue albuterol.  Will start Symbicort as noted above.  Dyslipidemia Continue lifestyle modifications.  Patient will follow-up soon for CPE with blood work.     Subjective:  HPI:  Cough  Started about a month ago.  She had recently completed a run and started having a mild cough at that time.  Symptoms progressed over the next 1 to 2 weeks with more frequent cough.  Over the last couple weeks cough is become less frequent however she is having more severe coughs.  She has tried using her albuterol inhaler which has not helped significantly.  Tried several over-the-counter medications which have not helped either.  She initially had a few low-grade fevers at the onset of her illness however has not had anything for the last few weeks.  Cough is sometimes productive of yellow sputum.  She has had some associated shortness of breath and wheeze as well.  Also has had some associated rhinorrhea and sore throat over the past week. No reflux symptoms.   Her stable, chronic medical conditions are outlined below:  # Exercise Induce Asthma - Uses albuterol as needed  # Dyslipidemia - Diet controlled off medications  # PCOS / Endometriosis - Follows with GYN - On OCP  ROS: Per HPI, otherwise a complete review of systems was  negative.   PMH:  The following were reviewed and entered/updated in epic: Past Medical History:  Diagnosis Date   Hyperlipidemia     Ideal LDL goal = < 70 based on NMR Lipoprofile   Patient Active Problem List   Diagnosis Date Noted   Dyslipidemia 10/12/2018   History of nephrolithiasis 10/12/2018   Exercise induced bronchospasm 06/30/2008   PCOS (polycystic ovarian syndrome) 07/18/2006   Past Surgical History:  Procedure Laterality Date   Labial Cystectomy     LUMBAR DISC SURGERY  1990   L4, Dr Hal Neer   Otic Tubes     SEPTOPLASTY     WISDOM TOOTH EXTRACTION      Family History  Problem Relation Age of Onset   Diabetes Father    Gout Father    Heart attack Mother 34       Smoker   Diabetes Mother    Breast cancer Mother    Heart attack Maternal Grandfather 23   Diabetes Cousin    Seizures Paternal Grandfather    Stroke Neg Hx     Medications- reviewed and updated Current Outpatient Medications  Medication Sig Dispense Refill   albuterol (VENTOLIN HFA) 108 (90 BASE) MCG/ACT inhaler Inhale 2 puffs into the lungs as directed. 1-2 puffs every 4-6 hours as needed 8.5 g 11   cholecalciferol (VITAMIN D3) 25 MCG (1000 UT) tablet      Levonorgestrel-Ethinyl Estradiol (AMETHIA) 0.1-0.02 & 0.01 MG tablet      azelastine (ASTELIN) 0.1 % nasal spray Place  1 spray into both nostrils 2 (two) times daily. Use in each nostril as directed 30 mL 0   budesonide-formoterol (SYMBICORT) 80-4.5 MCG/ACT inhaler Inhale 2 puffs into the lungs 2 (two) times a day. 1 Inhaler 1   No current facility-administered medications for this visit.     Allergies-reviewed and updated Allergies  Allergen Reactions   Sulfonamide Derivatives     Rash as child    Social History   Socioeconomic History   Marital status: Single    Spouse name: Not on file   Number of children: Not on file   Years of education: Not on file   Highest education level: Not on file    Occupational History   Not on file  Social Needs   Financial resource strain: Not on file   Food insecurity:    Worry: Not on file    Inability: Not on file   Transportation needs:    Medical: Not on file    Non-medical: Not on file  Tobacco Use   Smoking status: Never Smoker   Smokeless tobacco: Never Used  Substance and Sexual Activity   Alcohol use: Yes    Comment: Rarely   Drug use: No   Sexual activity: Not on file  Lifestyle   Physical activity:    Days per week: Not on file    Minutes per session: Not on file   Stress: Not on file  Relationships   Social connections:    Talks on phone: Not on file    Gets together: Not on file    Attends religious service: Not on file    Active member of club or organization: Not on file    Attends meetings of clubs or organizations: Not on file    Relationship status: Not on file  Other Topics Concern   Not on file  Social History Narrative   Not on file          Objective/Observations  Physical Exam: Gen: NAD, resting comfortably EYES: EOMI. No scleral icterus CV: No peripheral edema Pulm: Normal work of breathing, speaking in full sentences MSK: Full range of motion.  No digital cyanosis. Skin: No rashes or lesions Neuro: Grossly normal, moves all extremities Psych: Normal affect and thought content  Virtual Visit via Video   I connected with Cindy Pittman on 10/12/18 at  9:40 AM EDT by a video enabled telemedicine application and verified that I am speaking with the correct person using two identifiers. I discussed the limitations of evaluation and management by telemedicine and the availability of in person appointments. The patient expressed understanding and agreed to proceed.   Patient location: Home Provider location: [place] participating in the virtual visit: Myself and Patient     Algis Greenhouse. Jerline Pain, MD 10/12/2018 11:46 AM

## 2018-10-12 NOTE — Assessment & Plan Note (Signed)
Stable.  Continue management per gynecology.

## 2018-10-15 ENCOUNTER — Encounter: Payer: Self-pay | Admitting: Family Medicine

## 2018-11-01 ENCOUNTER — Encounter: Payer: Self-pay | Admitting: Family Medicine

## 2018-11-02 ENCOUNTER — Other Ambulatory Visit: Payer: Self-pay

## 2018-11-02 MED ORDER — DOXYCYCLINE HYCLATE 100 MG PO TABS: 100.0000 mg | ORAL_TABLET | Freq: Two times a day (BID) | ORAL | 0 refills | Status: DC

## 2018-11-02 MED ORDER — PREDNISONE 50 MG PO TABS: 50.0000 mg | ORAL_TABLET | Freq: Every day | ORAL | 0 refills | Status: AC

## 2018-12-05 ENCOUNTER — Encounter: Payer: Self-pay | Admitting: Family Medicine

## 2018-12-07 LAB — HM PAP SMEAR: HM Pap smear: NEGATIVE

## 2018-12-11 ENCOUNTER — Other Ambulatory Visit: Payer: Self-pay

## 2018-12-11 ENCOUNTER — Encounter: Payer: Self-pay | Admitting: Family Medicine

## 2018-12-11 ENCOUNTER — Ambulatory Visit (INDEPENDENT_AMBULATORY_CARE_PROVIDER_SITE_OTHER): Payer: BC Managed Care – PPO | Admitting: Family Medicine

## 2018-12-11 VITALS — BP 122/72 | HR 55 | Temp 98.5°F | Ht 66.5 in | Wt 180.2 lb

## 2018-12-11 DIAGNOSIS — J309 Allergic rhinitis, unspecified: Secondary | ICD-10-CM

## 2018-12-11 DIAGNOSIS — Z0001 Encounter for general adult medical examination with abnormal findings: Secondary | ICD-10-CM

## 2018-12-11 DIAGNOSIS — E785 Hyperlipidemia, unspecified: Secondary | ICD-10-CM

## 2018-12-11 DIAGNOSIS — J45990 Exercise induced bronchospasm: Secondary | ICD-10-CM

## 2018-12-11 DIAGNOSIS — Z6828 Body mass index (BMI) 28.0-28.9, adult: Secondary | ICD-10-CM | POA: Diagnosis not present

## 2018-12-11 DIAGNOSIS — H6980 Other specified disorders of Eustachian tube, unspecified ear: Secondary | ICD-10-CM | POA: Insufficient documentation

## 2018-12-11 LAB — LIPID PANEL
Cholesterol: 175 mg/dL (ref 0–200)
HDL: 48 mg/dL (ref >39.00)
LDL Cholesterol: 103 mg/dL — ABNORMAL HIGH (ref 0–99)
NonHDL: 127.23
Total CHOL/HDL Ratio: 4
Triglycerides: 120 mg/dL (ref 0.0–149.0)
VLDL: 24 mg/dL (ref 0.0–40.0)

## 2018-12-11 LAB — COMPREHENSIVE METABOLIC PANEL
ALT: 17 U/L (ref 0–35)
AST: 15 U/L (ref 0–37)
Albumin: 4.1 g/dL (ref 3.5–5.2)
Alkaline Phosphatase: 102 U/L (ref 39–117)
BUN: 13 mg/dL (ref 6–23)
CO2: 25 mEq/L (ref 19–32)
Calcium: 9.1 mg/dL (ref 8.4–10.5)
Chloride: 105 mEq/L (ref 96–112)
Creatinine, Ser: 0.86 mg/dL (ref 0.40–1.20)
GFR: 71.8 mL/min (ref >60.00)
Glucose, Bld: 78 mg/dL (ref 70–99)
Potassium: 4.5 mEq/L (ref 3.5–5.1)
Sodium: 136 mEq/L (ref 135–145)
Total Bilirubin: 0.8 mg/dL (ref 0.2–1.2)
Total Protein: 6.5 g/dL (ref 6.0–8.3)

## 2018-12-11 LAB — CBC
HCT: 40.7 % (ref 36.0–46.0)
Hemoglobin: 13.7 g/dL (ref 12.0–15.0)
MCHC: 33.8 g/dL (ref 30.0–36.0)
MCV: 90.6 fl (ref 78.0–100.0)
Platelets: 285 10*3/uL (ref 150.0–400.0)
RBC: 4.49 Mil/uL (ref 3.87–5.11)
RDW: 13.3 % (ref 11.5–15.5)
WBC: 7.2 10*3/uL (ref 4.0–10.5)

## 2018-12-11 LAB — TSH: TSH: 2.3 u[IU]/mL (ref 0.35–4.50)

## 2018-12-11 MED ORDER — MONTELUKAST SODIUM 10 MG PO TABS: 10.0000 mg | ORAL_TABLET | Freq: Every day | ORAL | 3 refills | Status: DC

## 2018-12-11 NOTE — Assessment & Plan Note (Signed)
Continue Claritin daily.  Start Singulair.

## 2018-12-11 NOTE — Progress Notes (Signed)
Chief Complaint:  Cindy Pittman is a 43 y.o. female who presents today for her annual comprehensive physical exam.    Assessment/Plan:  Exercise induced bronchospasm Stable.  Continue albuterol prior to exercise.  Also start Singulair today.  Allergic rhinitis Continue Claritin daily.  Start Singulair.  Dyslipidemia Continue lifestyle modifications.  Check CBC, C met, TSH, and lipid panel.  BMI 28 Discussed lifestyle modifications  Preventative Healthcare: Obtain records from GYN for her Pap.  Check CBC, C met, lipid panel, and TSH.  Patient Counseling(The following topics were reviewed and/or handout was given):  -Nutrition: Stressed importance of moderation in sodium/caffeine intake, saturated fat and cholesterol, caloric balance, sufficient intake of fresh fruits, vegetables, and fiber.  -Stressed the importance of regular exercise.   -Substance Abuse: Discussed cessation/primary prevention of tobacco, alcohol, or other drug use; driving or other dangerous activities under the influence; availability of treatment for abuse.   -Injury prevention: Discussed safety belts, safety helmets, smoke detector, smoking near bedding or upholstery.   -Sexuality: Discussed sexually transmitted diseases, partner selection, use of condoms, avoidance of unintended pregnancy and contraceptive alternatives.   -Dental health: Discussed importance of regular tooth brushing, flossing, and dental visits.  -Health maintenance and immunizations reviewed. Please refer to Health maintenance section.  Return to care in 1 year for next preventative visit.     Subjective:  HPI:  She has no acute complaints today.   She has had some fullness in her left ear. No pain, fevers, or chills.   # Exercise induced Bronchospasm - Uses albuterol as needed - No recent flares  # Allergic Rhinitis - Takes claritin as needed seasonally.   Lifestyle Diet: Pescetarian. Tries to eat plenty of fruits and  vegetables.  Exercise: Likes to run 3 days per week and walks dog and hour per day.   Depression screen PHQ 2/9 12/11/2018  Decreased Interest 0  Down, Depressed, Hopeless 0  PHQ - 2 Score 0    Health Maintenance Due  Topic Date Due  . HIV Screening  04/15/1990    ROS: Per HPI, otherwise a complete review of systems was negative.   PMH:  The following were reviewed and entered/updated in epic: Past Medical History:  Diagnosis Date  . Hyperlipidemia     Ideal LDL goal = < 70 based on NMR Lipoprofile   Patient Active Problem List   Diagnosis Date Noted  . Allergic rhinitis 12/11/2018  . Dyslipidemia 10/12/2018  . History of nephrolithiasis 10/12/2018  . Exercise induced bronchospasm 06/30/2008  . PCOS (polycystic ovarian syndrome) 07/18/2006   Past Surgical History:  Procedure Laterality Date  . Labial Cystectomy    . LUMBAR DISC SURGERY  1990   L4, Dr Hal Neer  . Otic Tubes    . SEPTOPLASTY    . WISDOM TOOTH EXTRACTION      Family History  Problem Relation Age of Onset  . Diabetes Father   . Gout Father   . Heart attack Mother 45       Smoker  . Diabetes Mother   . Breast cancer Mother   . Heart attack Maternal Grandfather 54  . Diabetes Cousin   . Seizures Paternal Grandfather   . Stroke Neg Hx     Medications- reviewed and updated Current Outpatient Medications  Medication Sig Dispense Refill  . albuterol (VENTOLIN HFA) 108 (90 BASE) MCG/ACT inhaler Inhale 2 puffs into the lungs as directed. 1-2 puffs every 4-6 hours as needed 8.5 g 11  . Cholecalciferol (  VITAMIN D-3) 25 MCG (1000 UT) CAPS Take by mouth.    . Levonorgestrel-Ethinyl Estradiol (AMETHIA) 0.1-0.02 & 0.01 MG tablet     . loratadine (CLARITIN) 10 MG tablet Take 10 mg by mouth daily.    . cholecalciferol (VITAMIN D3) 25 MCG (1000 UT) tablet     . montelukast (SINGULAIR) 10 MG tablet Take 1 tablet (10 mg total) by mouth at bedtime. 30 tablet 3   No current facility-administered medications for  this visit.     Allergies-reviewed and updated Allergies  Allergen Reactions  . Sulfonamide Derivatives     Rash as child    Social History   Socioeconomic History  . Marital status: Single    Spouse name: Not on file  . Number of children: Not on file  . Years of education: Not on file  . Highest education level: Not on file  Occupational History  . Not on file  Social Needs  . Financial resource strain: Not on file  . Food insecurity    Worry: Not on file    Inability: Not on file  . Transportation needs    Medical: Not on file    Non-medical: Not on file  Tobacco Use  . Smoking status: Never Smoker  . Smokeless tobacco: Never Used  Substance and Sexual Activity  . Alcohol use: Yes    Comment: Rarely  . Drug use: No  . Sexual activity: Not on file  Lifestyle  . Physical activity    Days per week: Not on file    Minutes per session: Not on file  . Stress: Not on file  Relationships  . Social Herbalist on phone: Not on file    Gets together: Not on file    Attends religious service: Not on file    Active member of club or organization: Not on file    Attends meetings of clubs or organizations: Not on file    Relationship status: Not on file  Other Topics Concern  . Not on file  Social History Narrative  . Not on file        Objective:  Physical Exam: BP 122/72 (BP Location: Left Arm, Patient Position: Sitting, Cuff Size: Normal)   Pulse (!) 55   Temp 98.5 F (36.9 C) (Oral)   Ht 5' 6.5" (1.689 m)   Wt 180 lb 3.2 oz (81.7 kg)   LMP 10/11/2018   SpO2 98%   BMI 28.65 kg/m   Body mass index is 28.65 kg/m. Wt Readings from Last 3 Encounters:  12/11/18 180 lb 3.2 oz (81.7 kg)  10/12/18 178 lb (80.7 kg)  08/14/12 174 lb (78.9 kg)   Gen: NAD, resting comfortably HEENT: TMs normal bilaterally with mild clear effusions. OP clear. No thyromegaly noted.  CV: RRR with no murmurs appreciated Pulm: NWOB, CTAB with no crackles, wheezes, or  rhonchi GI: Normal bowel sounds present. Soft, Nontender, Nondistended. MSK: no edema, cyanosis, or clubbing noted Skin: warm, dry Neuro: CN2-12 grossly intact. Strength 5/5 in upper and lower extremities. Reflexes symmetric and intact bilaterally.  Psych: Normal affect and thought content      M. Jerline Pain, MD 12/11/2018 9:34 AM

## 2018-12-11 NOTE — Patient Instructions (Signed)
It was very nice to see you today!  Please try the Singulair.  Let me know if your symptoms do not improve or if they worsen.  We will check blood work today.   Eat at least 3 REAL meals and 1-2 snacks per day.  Aim for no more than 5 hours between eating.  Eat breakfast within one hour of getting up.    Obtain twice as many fruits/vegetables as protein or carbohydrate foods for both lunch and dinner.   Cut down on sweet beverages. This includes juice, soda, and sweet tea.    Exercise at least 150 minutes every week.   Come back to see me in 1 year or sooner if your next physical.  Take care, Dr Jerline Pain   Preventive Care 65-4 Years Old, Female Preventive care refers to visits with your health care provider and lifestyle choices that can promote health and wellness. This includes:  A yearly physical exam. This may also be called an annual well check.  Regular dental visits and eye exams.  Immunizations.  Screening for certain conditions.  Healthy lifestyle choices, such as eating a healthy diet, getting regular exercise, not using drugs or products that contain nicotine and tobacco, and limiting alcohol use. What can I expect for my preventive care visit? Physical exam Your health care provider will check your:  Height and weight. This may be used to calculate body mass index (BMI), which tells if you are at a healthy weight.  Heart rate and blood pressure.  Skin for abnormal spots. Counseling Your health care provider may ask you questions about your:  Alcohol, tobacco, and drug use.  Emotional well-being.  Home and relationship well-being.  Sexual activity.  Eating habits.  Work and work Statistician.  Method of birth control.  Menstrual cycle.  Pregnancy history. What immunizations do I need?  Influenza (flu) vaccine  This is recommended every year. Tetanus, diphtheria, and pertussis (Tdap) vaccine  You may need a Td booster every 10 years.  Varicella (chickenpox) vaccine  You may need this if you have not been vaccinated. Zoster (shingles) vaccine  You may need this after age 10. Measles, mumps, and rubella (MMR) vaccine  You may need at least one dose of MMR if you were born in 1957 or later. You may also need a second dose. Pneumococcal conjugate (PCV13) vaccine  You may need this if you have certain conditions and were not previously vaccinated. Pneumococcal polysaccharide (PPSV23) vaccine  You may need one or two doses if you smoke cigarettes or if you have certain conditions. Meningococcal conjugate (MenACWY) vaccine  You may need this if you have certain conditions. Hepatitis A vaccine  You may need this if you have certain conditions or if you travel or work in places where you may be exposed to hepatitis A. Hepatitis B vaccine  You may need this if you have certain conditions or if you travel or work in places where you may be exposed to hepatitis B. Haemophilus influenzae type b (Hib) vaccine  You may need this if you have certain conditions. Human papillomavirus (HPV) vaccine  If recommended by your health care provider, you may need three doses over 6 months. You may receive vaccines as individual doses or as more than one vaccine together in one shot (combination vaccines). Talk with your health care provider about the risks and benefits of combination vaccines. What tests do I need? Blood tests  Lipid and cholesterol levels. These may be checked every 5  years, or more frequently if you are over 67 years old.  Hepatitis C test.  Hepatitis B test. Screening  Lung cancer screening. You may have this screening every year starting at age 87 if you have a 30-pack-year history of smoking and currently smoke or have quit within the past 15 years.  Colorectal cancer screening. All adults should have this screening starting at age 58 and continuing until age 103. Your health care provider may recommend  screening at age 43 if you are at increased risk. You will have tests every 1-10 years, depending on your results and the type of screening test.  Diabetes screening. This is done by checking your blood sugar (glucose) after you have not eaten for a while (fasting). You may have this done every 1-3 years.  Mammogram. This may be done every 1-2 years. Talk with your health care provider about when you should start having regular mammograms. This may depend on whether you have a family history of breast cancer.  BRCA-related cancer screening. This may be done if you have a family history of breast, ovarian, tubal, or peritoneal cancers.  Pelvic exam and Pap test. This may be done every 3 years starting at age 36. Starting at age 62, this may be done every 5 years if you have a Pap test in combination with an HPV test. Other tests  Sexually transmitted disease (STD) testing.  Bone density scan. This is done to screen for osteoporosis. You may have this scan if you are at high risk for osteoporosis. Follow these instructions at home: Eating and drinking  Eat a diet that includes fresh fruits and vegetables, whole grains, lean protein, and low-fat dairy.  Take vitamin and mineral supplements as recommended by your health care provider.  Do not drink alcohol if: ? Your health care provider tells you not to drink. ? You are pregnant, may be pregnant, or are planning to become pregnant.  If you drink alcohol: ? Limit how much you have to 0-1 drink a day. ? Be aware of how much alcohol is in your drink. In the [place] one drink equals one 12 oz bottle of beer (355 mL), one 5 oz glass of wine (148 mL), or one 1 oz glass of hard liquor (44 mL). Lifestyle  Take daily care of your teeth and gums.  Stay active. Exercise for at least 30 minutes on 5 or more days each week.  Do not use any products that contain nicotine or tobacco, such as cigarettes, e-cigarettes, and chewing tobacco. If you need  help quitting, ask your health care provider.  If you are sexually active, practice safe sex. Use a condom or other form of birth control (contraception) in order to prevent pregnancy and STIs (sexually transmitted infections).  If told by your health care provider, take low-dose aspirin daily starting at age 69. What's next?  Visit your health care provider once a year for a well check visit.  Ask your health care provider how often you should have your eyes and teeth checked.  Stay up to date on all vaccines. This information is not intended to replace advice given to you by your health care provider. Make sure you discuss any questions you have with your health care provider. Document Released: 06/26/2015 Document Revised: 02/08/2018 Document Reviewed: 02/08/2018 Elsevier Patient Education  2020 [employer].

## 2018-12-11 NOTE — Assessment & Plan Note (Signed)
Continue lifestyle modifications.  Check CBC, C met, TSH, and lipid panel. 

## 2018-12-11 NOTE — Assessment & Plan Note (Signed)
Stable.  Continue albuterol prior to exercise.  Also start Singulair today.

## 2018-12-12 ENCOUNTER — Other Ambulatory Visit: Payer: Self-pay | Admitting: Family Medicine

## 2018-12-12 NOTE — Progress Notes (Signed)
Please inform pt:  Her bad cholesterol is a bit elevated but everything else looks normal.  No need to start medications.  Continue working on diet and exercise and we will recheck in 1 year.  Cindy Pittman. Jerline Pain, MD 12/12/2018 8:06 AM

## 2018-12-20 LAB — HM MAMMOGRAPHY

## 2019-01-01 ENCOUNTER — Encounter: Payer: Self-pay | Admitting: Family Medicine

## 2019-01-20 ENCOUNTER — Encounter: Payer: Self-pay | Admitting: Family Medicine

## 2019-01-21 ENCOUNTER — Other Ambulatory Visit: Payer: Self-pay

## 2019-01-21 DIAGNOSIS — J45990 Exercise induced bronchospasm: Secondary | ICD-10-CM

## 2019-01-21 MED ORDER — ALBUTEROL SULFATE HFA 108 (90 BASE) MCG/ACT IN AERS: 2.0000 | INHALATION_SPRAY | RESPIRATORY_TRACT | 11 refills | Status: DC

## 2019-05-26 ENCOUNTER — Other Ambulatory Visit: Payer: Self-pay | Admitting: Family Medicine

## 2019-09-25 ENCOUNTER — Encounter: Payer: Self-pay | Admitting: Family Medicine

## 2019-09-25 ENCOUNTER — Telehealth (INDEPENDENT_AMBULATORY_CARE_PROVIDER_SITE_OTHER): Payer: BC Managed Care – PPO | Admitting: Family Medicine

## 2019-09-25 DIAGNOSIS — J329 Chronic sinusitis, unspecified: Secondary | ICD-10-CM

## 2019-09-25 DIAGNOSIS — J309 Allergic rhinitis, unspecified: Secondary | ICD-10-CM

## 2019-09-25 MED ORDER — AZELASTINE HCL 0.1 % NA SOLN: 2.0000 | Freq: Two times a day (BID) | NASAL | 12 refills | Status: DC

## 2019-09-25 MED ORDER — AMOXICILLIN-POT CLAVULANATE 875-125 MG PO TABS: 1.0000 | ORAL_TABLET | Freq: Two times a day (BID) | ORAL | 0 refills | Status: DC

## 2019-09-25 NOTE — Assessment & Plan Note (Signed)
Worsened due to pollen outbreak.  Continue Claritin.  Will start Astelin as noted above which should help.  Continue Singulair 10 mg daily.

## 2019-09-25 NOTE — Progress Notes (Signed)
   Cindy Pittman is a 44 y.o. female who presents today for a virtual office visit.  Assessment/Plan:  New/Acute Problems: Sinusitis No red flags.  Start Augmentin.  Also start Astelin nasal spray.  Can continue using over-the-counter meds if needed.  Chronic Problems Addressed Today: Allergic rhinitis Worsened due to pollen outbreak.  Continue Claritin.  Will start Astelin as noted above which should help.  Continue Singulair 10 mg daily.     Subjective:  HPI: Patient here with concern for sinus infection.  Symptoms started several weeks ago.  She has had a flare of her allergies over that time.  She has been using over-the-counter allergy meds with modest improvement.  Feels like past sinus infections.  Is had bloody mucus production.  No reported fevers or chills.       Objective/Observations  Physical Exam: Gen: NAD, resting comfortably Pulm: Normal work of breathing Neuro: Grossly normal, moves all extremities Psych: Normal affect and thought content  Virtual Visit via Video   I connected with Cindy Pittman on 09/25/19 at  9:40 AM EDT by a video enabled telemedicine application and verified that I am speaking with the correct person using two identifiers. The limitations of evaluation and management by telemedicine and the availability of in person appointments were discussed. The patient expressed understanding and agreed to proceed.   Patient location: Home Provider location: [place] participating in the virtual visit: Myself and Patient     Algis Greenhouse. Jerline Pain, MD 09/25/2019 9:34 AM

## 2019-09-28 ENCOUNTER — Other Ambulatory Visit: Payer: Self-pay | Admitting: Family Medicine

## 2019-12-09 ENCOUNTER — Other Ambulatory Visit: Payer: Self-pay | Admitting: Obstetrics and Gynecology

## 2019-12-09 DIAGNOSIS — E049 Nontoxic goiter, unspecified: Secondary | ICD-10-CM

## 2019-12-13 ENCOUNTER — Encounter: Payer: BC Managed Care – PPO | Admitting: Family Medicine

## 2019-12-20 ENCOUNTER — Ambulatory Visit
Admission: RE | Admit: 2019-12-20 | Discharge: 2019-12-20 | Disposition: A | Discharge status: Home / Self Care | Payer: BC Managed Care – PPO | Source: Ambulatory Visit | Attending: Obstetrics and Gynecology | Admitting: Obstetrics and Gynecology

## 2019-12-20 DIAGNOSIS — E049 Nontoxic goiter, unspecified: Secondary | ICD-10-CM

## 2019-12-23 ENCOUNTER — Encounter: Payer: Self-pay | Admitting: Family Medicine

## 2019-12-23 ENCOUNTER — Other Ambulatory Visit: Payer: Self-pay

## 2019-12-23 ENCOUNTER — Ambulatory Visit (INDEPENDENT_AMBULATORY_CARE_PROVIDER_SITE_OTHER): Payer: BC Managed Care – PPO | Admitting: Family Medicine

## 2019-12-23 VITALS — BP 134/84 | HR 49 | Temp 98.0°F | Ht 66.5 in | Wt 183.2 lb

## 2019-12-23 DIAGNOSIS — E663 Overweight: Secondary | ICD-10-CM

## 2019-12-23 DIAGNOSIS — Z13220 Encounter for screening for lipoid disorders: Secondary | ICD-10-CM | POA: Diagnosis not present

## 2019-12-23 DIAGNOSIS — Z0001 Encounter for general adult medical examination with abnormal findings: Secondary | ICD-10-CM | POA: Diagnosis not present

## 2019-12-23 DIAGNOSIS — E785 Hyperlipidemia, unspecified: Secondary | ICD-10-CM

## 2019-12-23 DIAGNOSIS — J45990 Exercise induced bronchospasm: Secondary | ICD-10-CM

## 2019-12-23 DIAGNOSIS — Z6829 Body mass index (BMI) 29.0-29.9, adult: Secondary | ICD-10-CM

## 2019-12-23 DIAGNOSIS — J329 Chronic sinusitis, unspecified: Secondary | ICD-10-CM

## 2019-12-23 DIAGNOSIS — E041 Nontoxic single thyroid nodule: Secondary | ICD-10-CM | POA: Diagnosis not present

## 2019-12-23 DIAGNOSIS — J309 Allergic rhinitis, unspecified: Secondary | ICD-10-CM

## 2019-12-23 LAB — CBC
HCT: 39.2 % (ref 36.0–46.0)
Hemoglobin: 13.4 g/dL (ref 12.0–15.0)
MCHC: 34.1 g/dL (ref 30.0–36.0)
MCV: 89.6 fl (ref 78.0–100.0)
Platelets: 302 10*3/uL (ref 150.0–400.0)
RBC: 4.38 Mil/uL (ref 3.87–5.11)
RDW: 12.6 % (ref 11.5–15.5)
WBC: 7.5 10*3/uL (ref 4.0–10.5)

## 2019-12-23 LAB — LIPID PANEL
Cholesterol: 178 mg/dL (ref 0–200)
HDL: 40.3 mg/dL (ref >39.00)
LDL Cholesterol: 112 mg/dL — ABNORMAL HIGH (ref 0–99)
NonHDL: 137.88
Total CHOL/HDL Ratio: 4
Triglycerides: 127 mg/dL (ref 0.0–149.0)
VLDL: 25.4 mg/dL (ref 0.0–40.0)

## 2019-12-23 LAB — COMPREHENSIVE METABOLIC PANEL
ALT: 16 U/L (ref 0–35)
AST: 15 U/L (ref 0–37)
Albumin: 4.5 g/dL (ref 3.5–5.2)
Alkaline Phosphatase: 99 U/L (ref 39–117)
BUN: 9 mg/dL (ref 6–23)
CO2: 25 mEq/L (ref 19–32)
Calcium: 9.5 mg/dL (ref 8.4–10.5)
Chloride: 102 mEq/L (ref 96–112)
Creatinine, Ser: 0.87 mg/dL (ref 0.40–1.20)
GFR: 70.51 mL/min (ref >60.00)
Glucose, Bld: 81 mg/dL (ref 70–99)
Potassium: 4 mEq/L (ref 3.5–5.1)
Sodium: 136 mEq/L (ref 135–145)
Total Bilirubin: 0.8 mg/dL (ref 0.2–1.2)
Total Protein: 6.7 g/dL (ref 6.0–8.3)

## 2019-12-23 MED ORDER — BUDESONIDE-FORMOTEROL FUMARATE 80-4.5 MCG/ACT IN AERO: INHALATION_SPRAY | RESPIRATORY_TRACT | 1 refills | Status: DC

## 2019-12-23 MED ORDER — ALBUTEROL SULFATE HFA 108 (90 BASE) MCG/ACT IN AERS: 2.0000 | INHALATION_SPRAY | RESPIRATORY_TRACT | 11 refills | Status: DC

## 2019-12-23 MED ORDER — AZELASTINE HCL 0.1 % NA SOLN: 2.0000 | Freq: Two times a day (BID) | NASAL | 12 refills | Status: DC

## 2019-12-23 MED ORDER — PREDNISONE 50 MG PO TABS
ORAL_TABLET | ORAL | 0 refills | Status: DC
Start: 2019-12-23 — End: 2020-09-17

## 2019-12-23 MED ORDER — AMOXICILLIN-POT CLAVULANATE 875-125 MG PO TABS: 1.0000 | ORAL_TABLET | Freq: Two times a day (BID) | ORAL | 0 refills | Status: DC

## 2019-12-23 NOTE — Assessment & Plan Note (Signed)
Continue management per bariatric clinic.

## 2019-12-23 NOTE — Patient Instructions (Signed)
It was very nice to see you today!  We will check blood work today.  I will send in a nasal spray, antibiotic, and prednisone for your sinus infection.  Please let me know if you need a referral for your thyroid.  I will see back in year for your next physical.  Please come back to see me sooner if needed.  Take care, Dr Jerline Pain  Please try these tips to maintain a healthy lifestyle:   Eat at least 3 REAL meals and 1-2 snacks per day.  Aim for no more than 5 hours between eating.  If you eat breakfast, please do so within one hour of getting up.    Each meal should contain half fruits/vegetables, one quarter protein, and one quarter carbs (no bigger than a computer mouse)   Cut down on sweet beverages. This includes juice, soda, and sweet tea.     Drink at least 1 glass of water with each meal and aim for at least 8 glasses per day   Exercise at least 150 minutes every week.    Preventive Care 28-34 Years Old, Female Preventive care refers to visits with your health care provider and lifestyle choices that can promote health and wellness. This includes:  A yearly physical exam. This may also be called an annual well check.  Regular dental visits and eye exams.  Immunizations.  Screening for certain conditions.  Healthy lifestyle choices, such as eating a healthy diet, getting regular exercise, not using drugs or products that contain nicotine and tobacco, and limiting alcohol use. What can I expect for my preventive care visit? Physical exam Your health care provider will check your:  Height and weight. This may be used to calculate body mass index (BMI), which tells if you are at a healthy weight.  Heart rate and blood pressure.  Skin for abnormal spots. Counseling Your health care provider may ask you questions about your:  Alcohol, tobacco, and drug use.  Emotional well-being.  Home and relationship well-being.  Sexual activity.  Eating  habits.  Work and work Statistician.  Method of birth control.  Menstrual cycle.  Pregnancy history. What immunizations do I need?  Influenza (flu) vaccine  This is recommended every year. Tetanus, diphtheria, and pertussis (Tdap) vaccine  You may need a Td booster every 10 years. Varicella (chickenpox) vaccine  You may need this if you have not been vaccinated. Zoster (shingles) vaccine  You may need this after age 55. Measles, mumps, and rubella (MMR) vaccine  You may need at least one dose of MMR if you were born in 1957 or later. You may also need a second dose. Pneumococcal conjugate (PCV13) vaccine  You may need this if you have certain conditions and were not previously vaccinated. Pneumococcal polysaccharide (PPSV23) vaccine  You may need one or two doses if you smoke cigarettes or if you have certain conditions. Meningococcal conjugate (MenACWY) vaccine  You may need this if you have certain conditions. Hepatitis A vaccine  You may need this if you have certain conditions or if you travel or work in places where you may be exposed to hepatitis A. Hepatitis B vaccine  You may need this if you have certain conditions or if you travel or work in places where you may be exposed to hepatitis B. Haemophilus influenzae type b (Hib) vaccine  You may need this if you have certain conditions. Human papillomavirus (HPV) vaccine  If recommended by your health care provider, you may need  three doses over 6 months. You may receive vaccines as individual doses or as more than one vaccine together in one shot (combination vaccines). Talk with your health care provider about the risks and benefits of combination vaccines. What tests do I need? Blood tests  Lipid and cholesterol levels. These may be checked every 5 years, or more frequently if you are over 3 years old.  Hepatitis C test.  Hepatitis B test. Screening  Lung cancer screening. You may have this screening  every year starting at age 20 if you have a 30-pack-year history of smoking and currently smoke or have quit within the past 15 years.  Colorectal cancer screening. All adults should have this screening starting at age 58 and continuing until age 85. Your health care provider may recommend screening at age 74 if you are at increased risk. You will have tests every 1-10 years, depending on your results and the type of screening test.  Diabetes screening. This is done by checking your blood sugar (glucose) after you have not eaten for a while (fasting). You may have this done every 1-3 years.  Mammogram. This may be done every 1-2 years. Talk with your health care provider about when you should start having regular mammograms. This may depend on whether you have a family history of breast cancer.  BRCA-related cancer screening. This may be done if you have a family history of breast, ovarian, tubal, or peritoneal cancers.  Pelvic exam and Pap test. This may be done every 3 years starting at age 6. Starting at age 32, this may be done every 5 years if you have a Pap test in combination with an HPV test. Other tests  Sexually transmitted disease (STD) testing.  Bone density scan. This is done to screen for osteoporosis. You may have this scan if you are at high risk for osteoporosis. Follow these instructions at home: Eating and drinking  Eat a diet that includes fresh fruits and vegetables, whole grains, lean protein, and low-fat dairy.  Take vitamin and mineral supplements as recommended by your health care provider.  Do not drink alcohol if: ? Your health care provider tells you not to drink. ? You are pregnant, may be pregnant, or are planning to become pregnant.  If you drink alcohol: ? Limit how much you have to 0-1 drink a day. ? Be aware of how much alcohol is in your drink. In the [place] one drink equals one 12 oz bottle of beer (355 mL), one 5 oz glass of wine (148 mL), or one 1  oz glass of hard liquor (44 mL). Lifestyle  Take daily care of your teeth and gums.  Stay active. Exercise for at least 30 minutes on 5 or more days each week.  Do not use any products that contain nicotine or tobacco, such as cigarettes, e-cigarettes, and chewing tobacco. If you need help quitting, ask your health care provider.  If you are sexually active, practice safe sex. Use a condom or other form of birth control (contraception) in order to prevent pregnancy and STIs (sexually transmitted infections).  If told by your health care provider, take low-dose aspirin daily starting at age 83. What's next?  Visit your health care provider once a year for a well check visit.  Ask your health care provider how often you should have your eyes and teeth checked.  Stay up to date on all vaccines. This information is not intended to replace advice given to you by your  health care provider. Make sure you discuss any questions you have with your health care provider. Document Revised: 02/08/2018 Document Reviewed: 02/08/2018 Elsevier Patient Education  2020 [employer].

## 2019-12-23 NOTE — Assessment & Plan Note (Signed)
Continue lifestyle modifications.  Check CBC, CMET, lipid panel today.

## 2019-12-23 NOTE — Assessment & Plan Note (Signed)
Continue Singulair and Claritin.

## 2019-12-23 NOTE — Progress Notes (Signed)
Chief Complaint:  Cindy Pittman is a 44 y.o. female who presents today for her annual comprehensive physical exam.    Assessment/Plan:  New/Acute Problems: Sinusitis No red flags.  Will start Augmentin, prednisone, and Astelin.  If still no improvement would consider referral to ENT.  Thyroid nodule Found on ultrasound last week.  She will follow-up with gynecology.  Will likely need to have referral for biopsy.  Chronic Problems Addressed Today: Exercise induced bronchospasm Continue Singulair.  Refilled albuterol and Symbicort today.  Overweight Continue management per bariatric clinic.  Allergic rhinitis Continue Singulair and Claritin.  Dyslipidemia Continue lifestyle modifications.  Check CBC, CMET, lipid panel today.   Body mass index is 29.13 kg/m. / Overweight  BMI Metric Follow Up - 12/23/19 1349      BMI Metric Follow Up-Please document annually   BMI Metric Follow Up Education provided            Preventative Healthcare: Up-to-date on cancer screening.  Patient Counseling(The following topics were reviewed and/or handout was given):  -Nutrition: Stressed importance of moderation in sodium/caffeine intake, saturated fat and cholesterol, caloric balance, sufficient intake of fresh fruits, vegetables, and fiber.  -Stressed the importance of regular exercise.   -Substance Abuse: Discussed cessation/primary prevention of tobacco, alcohol, or other drug use; driving or other dangerous activities under the influence; availability of treatment for abuse.   -Injury prevention: Discussed safety belts, safety helmets, smoke detector, smoking near bedding or upholstery.   -Sexuality: Discussed sexually transmitted diseases, partner selection, use of condoms, avoidance of unintended pregnancy and contraceptive alternatives.   -Dental health: Discussed importance of regular tooth brushing, flossing, and dental visits.  -Health maintenance and immunizations reviewed.  Please refer to Health maintenance section.  Return to care in 1 year for next preventative visit.     Subjective:  HPI:   She has had some concerns for sinus infection for the past few weeks.  Has fluid buildup in her ear.  Facial pain and pressure as well.  Had symptoms similar few months ago and was treated with Augmentin.  Feels like previous sinus infections.   She saw her OB/GYN last week.  Will start to have thyroid nodule.  Had ultrasound done that resulted last Friday.  Had TSH done which was normal.  She is also following with bariatric clinic to help with weight loss.  Lifestyle Diet: Balanced. Trying to get more fruits and vegetables.  Exercise: Trying to get more into running.   Depression screen PHQ 2/9 12/11/2018  Decreased Interest 0  Down, Depressed, Hopeless 0  PHQ - 2 Score 0    Health Maintenance Due  Topic Date Due  . Hepatitis C Screening  Never done  . HIV Screening  Never done     ROS: Per HPI, otherwise a complete review of systems was negative.   PMH:  The following were reviewed and entered/updated in epic: Past Medical History:  Diagnosis Date  . Hyperlipidemia     Ideal LDL goal = < 70 based on NMR Lipoprofile   Patient Active Problem List   Diagnosis Date Noted  . Overweight 12/23/2019  . Allergic rhinitis 12/11/2018  . Dyslipidemia 10/12/2018  . History of nephrolithiasis 10/12/2018  . Exercise induced bronchospasm 06/30/2008  . PCOS (polycystic ovarian syndrome) 07/18/2006   Past Surgical History:  Procedure Laterality Date  . Labial Cystectomy    . LUMBAR DISC SURGERY  1990   L4, Dr Hal Neer  . Otic Tubes    .  SEPTOPLASTY    . WISDOM TOOTH EXTRACTION      Family History  Problem Relation Age of Onset  . Diabetes Father   . Gout Father   . Heart attack Mother 53       Smoker  . Diabetes Mother   . Breast cancer Mother   . Heart attack Maternal Grandfather 54  . Diabetes Cousin   . Seizures Paternal Grandfather   .  Stroke Neg Hx     Medications- reviewed and updated Current Outpatient Medications  Medication Sig Dispense Refill  . albuterol (VENTOLIN HFA) 108 (90 Base) MCG/ACT inhaler Inhale 2 puffs into the lungs as directed. 1-2 puffs every 4-6 hours as needed 8.5 g 11  . azelastine (ASTELIN) 0.1 % nasal spray Place 2 sprays into both nostrils 2 (two) times daily. 30 mL 12  . budesonide-formoterol (SYMBICORT) 80-4.5 MCG/ACT inhaler TAKE 2 PUFFS BY MOUTH TWICE A DAY 10.2 Inhaler 1  . Cholecalciferol (VITAMIN D-3) 25 MCG (1000 UT) CAPS Take by mouth.    . Diethylpropion HCl 25 MG TABS Take by mouth in the morning and at bedtime.    . Levonorgestrel-Ethinyl Estradiol (AMETHIA) 0.1-0.02 & 0.01 MG tablet     . loratadine (CLARITIN) 10 MG tablet Take 10 mg by mouth daily.    . montelukast (SINGULAIR) 10 MG tablet TAKE 1 TABLET BY MOUTH EVERYDAY AT BEDTIME 90 tablet 1  . amoxicillin-clavulanate (AUGMENTIN) 875-125 MG tablet Take 1 tablet by mouth 2 (two) times daily. 20 tablet 0  . predniSONE (DELTASONE) 50 MG tablet Take 1 tablet daily for 5 days. 5 tablet 0   No current facility-administered medications for this visit.    Allergies-reviewed and updated Allergies  Allergen Reactions  . Sulfonamide Derivatives     Rash as child    Social History   Socioeconomic History  . Marital status: Single    Spouse name: Not on file  . Number of children: Not on file  . Years of education: Not on file  . Highest education level: Not on file  Occupational History  . Not on file  Tobacco Use  . Smoking status: Never Smoker  . Smokeless tobacco: Never Used  Substance and Sexual Activity  . Alcohol use: Yes    Comment: Rarely  . Drug use: No  . Sexual activity: Not on file  Other Topics Concern  . Not on file  Social History Narrative  . Not on file   Social Determinants of Health   Financial Resource Strain:   . Difficulty of Paying Living Expenses:   Food Insecurity:   . Worried About  Charity fundraiser in the Last Year:   . Arboriculturist in the Last Year:   Transportation Needs:   . Film/video editor (Medical):   Marland Kitchen Lack of Transportation (Non-Medical):   Physical Activity:   . Days of Exercise per Week:   . Minutes of Exercise per Session:   Stress:   . Feeling of Stress :   Social Connections:   . Frequency of Communication with Friends and Family:   . Frequency of Social Gatherings with Friends and Family:   . Attends Religious Services:   . Active Member of Clubs or Organizations:   . Attends Archivist Meetings:   Marland Kitchen Marital Status:         Objective:  Physical Exam: BP 134/84   Pulse (!) 49   Temp 98 F (36.7 C)   Ht 5'  6.5" (1.689 m)   Wt 183 lb 4 oz (83.1 kg)   LMP 11/25/2019   SpO2 98%   BMI 29.13 kg/m   Body mass index is 29.13 kg/m. Wt Readings from Last 3 Encounters:  12/23/19 183 lb 4 oz (83.1 kg)  12/11/18 180 lb 3.2 oz (81.7 kg)  10/12/18 178 lb (80.7 kg)   Gen: NAD, resting comfortably HEENT: TMs with clear effusion bilaterally.. OP clear.  Right thyroid nodule noted. CV: RRR with no murmurs appreciated Pulm: NWOB, CTAB with no crackles, wheezes, or rhonchi GI: Normal bowel sounds present. Soft, Nontender, Nondistended. MSK: no edema, cyanosis, or clubbing noted Skin: warm, dry Neuro: CN2-12 grossly intact. Strength 5/5 in upper and lower extremities. Reflexes symmetric and intact bilaterally.  Psych: Normal affect and thought content     Keora Eccleston M. Jerline Pain, MD 12/23/2019 1:50 PM

## 2019-12-23 NOTE — Assessment & Plan Note (Signed)
Continue Singulair.  Refilled albuterol and Symbicort today.

## 2019-12-24 NOTE — Progress Notes (Signed)
Please inform patient of the following:  Her "bad" cholesterol is slightly up since last time. Everything else is NORMAL. Would like for her to keep working on diet and exercise and we can recheck in a year.  Cindy Pittman. Jerline Pain, MD 12/24/2019 12:49 PM

## 2020-01-09 ENCOUNTER — Other Ambulatory Visit: Payer: Self-pay

## 2020-01-09 ENCOUNTER — Encounter: Payer: Self-pay | Admitting: Family Medicine

## 2020-01-09 DIAGNOSIS — J309 Allergic rhinitis, unspecified: Secondary | ICD-10-CM

## 2020-01-09 DIAGNOSIS — J329 Chronic sinusitis, unspecified: Secondary | ICD-10-CM

## 2020-01-09 DIAGNOSIS — H6693 Otitis media, unspecified, bilateral: Secondary | ICD-10-CM

## 2020-01-09 MED ORDER — CIPROFLOXACIN-DEXAMETHASONE 0.3-0.1 % OT SUSP
4.0000 [drp] | Freq: Two times a day (BID) | OTIC | 0 refills | Status: DC
Start: 2020-01-09 — End: 2021-05-03

## 2020-01-14 ENCOUNTER — Other Ambulatory Visit: Payer: Self-pay | Admitting: Surgery

## 2020-01-14 DIAGNOSIS — E041 Nontoxic single thyroid nodule: Secondary | ICD-10-CM

## 2020-01-20 NOTE — Telephone Encounter (Signed)
Have you  heard anything about this?

## 2020-01-28 ENCOUNTER — Ambulatory Visit
Admission: RE | Admit: 2020-01-28 | Discharge: 2020-01-28 | Disposition: A | Discharge status: Home / Self Care | Payer: BC Managed Care – PPO | Source: Ambulatory Visit | Attending: Surgery | Admitting: Surgery

## 2020-01-28 ENCOUNTER — Other Ambulatory Visit (HOSPITAL_COMMUNITY)
Admission: RE | Admit: 2020-01-28 | Discharge: 2020-01-28 | Disposition: A | Discharge status: Home / Self Care | Payer: BC Managed Care – PPO | Source: Ambulatory Visit | Attending: Radiology | Admitting: Radiology

## 2020-01-28 DIAGNOSIS — E041 Nontoxic single thyroid nodule: Secondary | ICD-10-CM | POA: Insufficient documentation

## 2020-01-29 LAB — CYTOLOGY - NON PAP

## 2020-03-29 ENCOUNTER — Other Ambulatory Visit: Payer: Self-pay | Admitting: Family Medicine

## 2020-07-22 ENCOUNTER — Telehealth: Payer: Self-pay | Admitting: Family Medicine

## 2020-07-22 NOTE — Telephone Encounter (Signed)
[employer] form dropped off for at front desk for completion.  Verified that patient section of form has been completed.  Last DOS/WCC with PCP was .  Placed form in team folder to be completed by clinical staff.  Creig Hines

## 2020-08-05 ENCOUNTER — Ambulatory Visit: Payer: BC Managed Care – PPO | Admitting: Family Medicine

## 2020-08-05 ENCOUNTER — Ambulatory Visit: Payer: Self-pay

## 2020-08-05 ENCOUNTER — Other Ambulatory Visit: Payer: Self-pay

## 2020-08-05 VITALS — BP 122/84 | Ht 66.5 in | Wt 167.0 lb

## 2020-08-05 DIAGNOSIS — M25561 Pain in right knee: Secondary | ICD-10-CM | POA: Diagnosis not present

## 2020-08-05 NOTE — Patient Instructions (Signed)
You have quad tendinitis. Ice area 15 minutes at a time 3-4 times a day as needed. Aleve 2 tabs twice a day with food OR ibuprofen 622m three times a day with food for pain and inflammation only if needed. Straight leg raises, knee extensions, decline squats 3 sets of 10 once a day. Knee sleeve or tendon strap when up and walking around can be helpful. Consider physical therapy. Consider nitro patches if not improving.   Activities, running as tolerated. Follow up with me in 6 weeks.

## 2020-08-06 ENCOUNTER — Encounter: Payer: Self-pay | Admitting: Family Medicine

## 2020-08-06 NOTE — Progress Notes (Signed)
PCP: Vivi Barrack, MD  Subjective:   HPI: Patient is a 45 y.o. female here for right knee pain.  Patient reports she started running last year. No acute injury but over past few weeks she's noticed anterior right knee pain just above the kneecap. Pain is off and on and can be brief and sharp. Has tried icing, ibuprofen. Notices when using stairs also. No catching, locking, giving out.  Past Medical History:  Diagnosis Date  . Hyperlipidemia     Ideal LDL goal = < 70 based on NMR Lipoprofile    Current Outpatient Medications on File Prior to Visit  Medication Sig Dispense Refill  . albuterol (VENTOLIN HFA) 108 (90 Base) MCG/ACT inhaler Inhale 2 puffs into the lungs as directed. 1-2 puffs every 4-6 hours as needed 8.5 g 11  . amoxicillin-clavulanate (AUGMENTIN) 875-125 MG tablet Take 1 tablet by mouth 2 (two) times daily. 20 tablet 0  . azelastine (ASTELIN) 0.1 % nasal spray Place 2 sprays into both nostrils 2 (two) times daily. 30 mL 12  . budesonide-formoterol (SYMBICORT) 80-4.5 MCG/ACT inhaler TAKE 2 PUFFS BY MOUTH TWICE A DAY 10.2 Inhaler 1  . Cholecalciferol (VITAMIN D-3) 25 MCG (1000 UT) CAPS Take by mouth.    . ciprofloxacin-dexamethasone (CIPRODEX) OTIC suspension Place 4 drops into the left ear 2 (two) times daily. 7.5 mL 0  . Diethylpropion HCl 25 MG TABS Take by mouth in the morning and at bedtime.    . Levonorgestrel-Ethinyl Estradiol (AMETHIA) 0.1-0.02 & 0.01 MG tablet     . loratadine (CLARITIN) 10 MG tablet Take 10 mg by mouth daily.    . montelukast (SINGULAIR) 10 MG tablet TAKE 1 TABLET BY MOUTH EVERYDAY AT BEDTIME 90 tablet 1  . predniSONE (DELTASONE) 50 MG tablet Take 1 tablet daily for 5 days. 5 tablet 0   No current facility-administered medications on file prior to visit.    Past Surgical History:  Procedure Laterality Date  . Labial Cystectomy    . LUMBAR DISC SURGERY  1990   L4, Dr Hal Neer  . Otic Tubes    . SEPTOPLASTY    . WISDOM TOOTH EXTRACTION       Allergies  Allergen Reactions  . Sulfonamide Derivatives     Rash as child    Social History   Socioeconomic History  . Marital status: Single    Spouse name: Not on file  . Number of children: Not on file  . Years of education: Not on file  . Highest education level: Not on file  Occupational History  . Not on file  Tobacco Use  . Smoking status: Never Smoker  . Smokeless tobacco: Never Used  Substance and Sexual Activity  . Alcohol use: Yes    Comment: Rarely  . Drug use: No  . Sexual activity: Not on file  Other Topics Concern  . Not on file  Social History Narrative  . Not on file   Social Determinants of Health   Financial Resource Strain: Not on file  Food Insecurity: Not on file  Transportation Needs: Not on file  Physical Activity: Not on file  Stress: Not on file  Social Connections: Not on file  Intimate Partner Violence: Not on file    Family History  Problem Relation Age of Onset  . Diabetes Father   . Gout Father   . Heart attack Mother 12       Smoker  . Diabetes Mother   . Breast cancer Mother   .  Heart attack Maternal Grandfather 54  . Diabetes Cousin   . Seizures Paternal Grandfather   . Stroke Neg Hx     BP 122/84   Ht 5' 6.5" (1.689 m)   Wt 167 lb (75.8 kg)   BMI 26.55 kg/m   No flowsheet data found.  No flowsheet data found.  Review of Systems: See HPI above.     Objective:  Physical Exam:  Gen: NAD, comfortable in exam room  Right knee: No gross deformity, ecchymoses, swelling. No TTP. FROM with normal strength. Negative ant/post drawers. Negative valgus/varus testing. Negative lachman. Negative mcmurrays, apleys, thessalys NV intact distally.  Limited MSK u/s right knee:  Minimal effusion.  Quad tendon with insertional calcifications, hypoechoic change.  No neovascularity.  Medial and lateral menisci intact.   Assessment & Plan:  1. Right knee pain - 2/2 quadriceps tendinopathy.  Icing, home exercises and  stretches reviewed.  Compression sleeve.  NSAIDs if needed.  Consider physical therapy, nitro patches.  F/u in 6 weeks.

## 2020-09-16 ENCOUNTER — Other Ambulatory Visit: Payer: Self-pay

## 2020-09-16 ENCOUNTER — Ambulatory Visit (INDEPENDENT_AMBULATORY_CARE_PROVIDER_SITE_OTHER): Payer: BC Managed Care – PPO | Admitting: Family Medicine

## 2020-09-16 VITALS — BP 118/72 | Ht 66.5 in | Wt 167.0 lb

## 2020-09-16 DIAGNOSIS — M25561 Pain in right knee: Secondary | ICD-10-CM | POA: Diagnosis not present

## 2020-09-16 NOTE — Patient Instructions (Signed)
You have quad tendinitis. Continue your home exercises most days of the week at least the next 6 weeks. Increase running as we discussed every other day. Use the brace with exercise for the next 4-6 weeks also - consider trying without this if you feel great at that point. Ice area 15 minutes at a time 3-4 times a day as needed. Aleve 2 tabs twice a day with food OR ibuprofen 669m three times a day with food for pain and inflammation only if needed. Follow up with me as needed.

## 2020-09-17 ENCOUNTER — Encounter: Payer: Self-pay | Admitting: Family Medicine

## 2020-09-17 NOTE — Progress Notes (Signed)
PCP: Vivi Barrack, MD  Subjective:   HPI: Patient is a 45 y.o. female here for right knee pain.  2/23: Patient reports she started running last year. No acute injury but over past few weeks she's noticed anterior right knee pain just above the kneecap. Pain is off and on and can be brief and sharp. Has tried icing, ibuprofen. Notices when using stairs also. No catching, locking, giving out.  4/6: Patient reports she's improved compared to last visit. Running once a week - ran on Monday 3 miles and soreness above patella at beginning of run that improved with the run. No swelling, bruising. No new injuries. Not taking anything for pain.  Past Medical History:  Diagnosis Date  . Hyperlipidemia     Ideal LDL goal = < 70 based on NMR Lipoprofile    Current Outpatient Medications on File Prior to Visit  Medication Sig Dispense Refill  . albuterol (VENTOLIN HFA) 108 (90 Base) MCG/ACT inhaler Inhale 2 puffs into the lungs as directed. 1-2 puffs every 4-6 hours as needed 8.5 g 11  . amoxicillin-clavulanate (AUGMENTIN) 875-125 MG tablet Take 1 tablet by mouth 2 (two) times daily. 20 tablet 0  . azelastine (ASTELIN) 0.1 % nasal spray Place 2 sprays into both nostrils 2 (two) times daily. 30 mL 12  . budesonide-formoterol (SYMBICORT) 80-4.5 MCG/ACT inhaler TAKE 2 PUFFS BY MOUTH TWICE A DAY 10.2 Inhaler 1  . Cholecalciferol (VITAMIN D-3) 25 MCG (1000 UT) CAPS Take by mouth.    . ciprofloxacin-dexamethasone (CIPRODEX) OTIC suspension Place 4 drops into the left ear 2 (two) times daily. 7.5 mL 0  . Diethylpropion HCl 25 MG TABS Take by mouth in the morning and at bedtime.    . Levonorgestrel-Ethinyl Estradiol (AMETHIA) 0.1-0.02 & 0.01 MG tablet     . loratadine (CLARITIN) 10 MG tablet Take 10 mg by mouth daily.    . montelukast (SINGULAIR) 10 MG tablet TAKE 1 TABLET BY MOUTH EVERYDAY AT BEDTIME 90 tablet 1   No current facility-administered medications on file prior to visit.    Past  Surgical History:  Procedure Laterality Date  . Labial Cystectomy    . LUMBAR DISC SURGERY  1990   L4, Dr Hal Neer  . Otic Tubes    . SEPTOPLASTY    . WISDOM TOOTH EXTRACTION      Allergies  Allergen Reactions  . Sulfonamide Derivatives     Rash as child    Social History   Socioeconomic History  . Marital status: Single    Spouse name: Not on file  . Number of children: Not on file  . Years of education: Not on file  . Highest education level: Not on file  Occupational History  . Not on file  Tobacco Use  . Smoking status: Never Smoker  . Smokeless tobacco: Never Used  Substance and Sexual Activity  . Alcohol use: Yes    Comment: Rarely  . Drug use: No  . Sexual activity: Not on file  Other Topics Concern  . Not on file  Social History Narrative  . Not on file   Social Determinants of Health   Financial Resource Strain: Not on file  Food Insecurity: Not on file  Transportation Needs: Not on file  Physical Activity: Not on file  Stress: Not on file  Social Connections: Not on file  Intimate Partner Violence: Not on file    Family History  Problem Relation Age of Onset  . Diabetes Father   .  Gout Father   . Heart attack Mother 31       Smoker  . Diabetes Mother   . Breast cancer Mother   . Heart attack Maternal Grandfather 54  . Diabetes Cousin   . Seizures Paternal Grandfather   . Stroke Neg Hx     BP 118/72   Ht 5' 6.5" (1.689 m)   Wt 167 lb (75.8 kg)   BMI 26.55 kg/m   No flowsheet data found.  No flowsheet data found.  Review of Systems: See HPI above.     Objective:  Physical Exam:  Gen: NAD, comfortable in exam room  Right knee: No gross deformity, ecchymoses, swelling. No TTP. FROM with normal strength - no reproduction of pain with resisted extension. Negative ant/post drawers. Negative valgus/varus testing. Negative lachman. Negative mcmurrays, apleys. NV intact distally.   Assessment & Plan:  1. Right knee pain - 2/2  quad tendinopathy.  Improving with home exercises, compression sleeve.  Continue with home exercises, sleeve.  Icing, nsaids if needed.  F/u prn.

## 2020-11-19 ENCOUNTER — Other Ambulatory Visit: Payer: Self-pay

## 2020-11-19 ENCOUNTER — Ambulatory Visit: Payer: BC Managed Care – PPO | Admitting: Podiatry

## 2020-11-19 ENCOUNTER — Encounter: Payer: Self-pay | Admitting: Podiatry

## 2020-11-19 DIAGNOSIS — M21619 Bunion of unspecified foot: Secondary | ICD-10-CM

## 2020-11-19 DIAGNOSIS — L600 Ingrowing nail: Secondary | ICD-10-CM | POA: Diagnosis not present

## 2020-11-24 NOTE — Progress Notes (Signed)
Subjective:   Patient ID: Cindy Pittman, female   DOB: 45 y.o.   MRN: [id number]   HPI Patient states she stubbed her left big toe and is concerned about it bleeding and is concerned that its not going to fall off or other pathology may occur.  Patient does not smoke likes to be active   Review of Systems  All other systems reviewed and are negative.      Objective:  Physical Exam Vitals and nursing note reviewed.  Constitutional:      Appearance: She is well-developed.  Pulmonary:     Effort: Pulmonary effort is normal.  Musculoskeletal:        General: Normal range of motion.  Skin:    General: Skin is warm.  Neurological:     Mental Status: She is alert.    Neurovascular status intact muscle strength adequate range of motion adequate with patient's left hallux showing some slight discoloration in the nail bed localized no advanced erythema edema no proximal edema erythema drainage noted     Assessment:  Paronychia infection left hallux localized secondary to trauma with no indications of severe pathology     Plan:  H&P reviewed nail pathology and explained she may lose the nail but there is nothing currently we can do and I do not recommend forced removal.  May have to be removed if it gets partially detached or starts to drain and I educated her on this

## 2020-12-07 ENCOUNTER — Other Ambulatory Visit: Payer: Self-pay | Admitting: Surgery

## 2020-12-07 DIAGNOSIS — E041 Nontoxic single thyroid nodule: Secondary | ICD-10-CM

## 2020-12-16 ENCOUNTER — Other Ambulatory Visit: Payer: Self-pay

## 2020-12-16 ENCOUNTER — Ambulatory Visit
Admission: RE | Admit: 2020-12-16 | Discharge: 2020-12-16 | Disposition: A | Discharge status: Home / Self Care | Payer: BC Managed Care – PPO | Source: Ambulatory Visit | Attending: Surgery | Admitting: Surgery

## 2020-12-16 DIAGNOSIS — E041 Nontoxic single thyroid nodule: Secondary | ICD-10-CM

## 2020-12-29 ENCOUNTER — Ambulatory Visit (INDEPENDENT_AMBULATORY_CARE_PROVIDER_SITE_OTHER): Payer: BC Managed Care – PPO | Admitting: Family Medicine

## 2020-12-29 ENCOUNTER — Encounter: Payer: Self-pay | Admitting: Family Medicine

## 2020-12-29 ENCOUNTER — Other Ambulatory Visit: Payer: Self-pay

## 2020-12-29 VITALS — BP 109/69 | HR 50 | Temp 98.4°F | Ht 66.5 in | Wt 170.4 lb

## 2020-12-29 DIAGNOSIS — Z1211 Encounter for screening for malignant neoplasm of colon: Secondary | ICD-10-CM

## 2020-12-29 DIAGNOSIS — Z0001 Encounter for general adult medical examination with abnormal findings: Secondary | ICD-10-CM

## 2020-12-29 DIAGNOSIS — E041 Nontoxic single thyroid nodule: Secondary | ICD-10-CM

## 2020-12-29 DIAGNOSIS — E785 Hyperlipidemia, unspecified: Secondary | ICD-10-CM | POA: Diagnosis not present

## 2020-12-29 DIAGNOSIS — J309 Allergic rhinitis, unspecified: Secondary | ICD-10-CM | POA: Diagnosis not present

## 2020-12-29 DIAGNOSIS — J45990 Exercise induced bronchospasm: Secondary | ICD-10-CM

## 2020-12-29 DIAGNOSIS — Z131 Encounter for screening for diabetes mellitus: Secondary | ICD-10-CM

## 2020-12-29 LAB — CBC
HCT: 38.5 % (ref 36.0–46.0)
Hemoglobin: 13.5 g/dL (ref 12.0–15.0)
MCHC: 35.1 g/dL (ref 30.0–36.0)
MCV: 88.4 fl (ref 78.0–100.0)
Platelets: 267 10*3/uL (ref 150.0–400.0)
RBC: 4.35 Mil/uL (ref 3.87–5.11)
RDW: 12.8 % (ref 11.5–15.5)
WBC: 7.2 10*3/uL (ref 4.0–10.5)

## 2020-12-29 LAB — COMPREHENSIVE METABOLIC PANEL
ALT: 18 U/L (ref 0–35)
AST: 15 U/L (ref 0–37)
Albumin: 4.2 g/dL (ref 3.5–5.2)
Alkaline Phosphatase: 95 U/L (ref 39–117)
BUN: 12 mg/dL (ref 6–23)
CO2: 25 mEq/L (ref 19–32)
Calcium: 9.1 mg/dL (ref 8.4–10.5)
Chloride: 107 mEq/L (ref 96–112)
Creatinine, Ser: 0.87 mg/dL (ref 0.40–1.20)
GFR: 80.3 mL/min (ref >60.00)
Glucose, Bld: 88 mg/dL (ref 70–99)
Potassium: 4.7 mEq/L (ref 3.5–5.1)
Sodium: 139 mEq/L (ref 135–145)
Total Bilirubin: 0.7 mg/dL (ref 0.2–1.2)
Total Protein: 6.7 g/dL (ref 6.0–8.3)

## 2020-12-29 LAB — LIPID PANEL
Cholesterol: 149 mg/dL (ref 0–200)
HDL: 44.1 mg/dL (ref >39.00)
LDL Cholesterol: 88 mg/dL (ref 0–99)
NonHDL: 105.07
Total CHOL/HDL Ratio: 3
Triglycerides: 84 mg/dL (ref 0.0–149.0)
VLDL: 16.8 mg/dL (ref 0.0–40.0)

## 2020-12-29 LAB — HEMOGLOBIN A1C: Hgb A1c MFr Bld: 5.3 % (ref 4.6–6.5)

## 2020-12-29 LAB — TSH: TSH: 2.76 u[IU]/mL (ref 0.35–5.50)

## 2020-12-29 MED ORDER — MONTELUKAST SODIUM 10 MG PO TABS: ORAL_TABLET | ORAL | 3 refills | Status: DC

## 2020-12-29 MED ORDER — ALBUTEROL SULFATE HFA 108 (90 BASE) MCG/ACT IN AERS: 2.0000 | INHALATION_SPRAY | RESPIRATORY_TRACT | 11 refills | Status: DC

## 2020-12-29 NOTE — Assessment & Plan Note (Signed)
Check labs today.  She has lost 10 to 15 pounds since last year.  Congratulated patient on weight loss and lifestyle modifications.

## 2020-12-29 NOTE — Progress Notes (Signed)
Please inform patient of the following:  Labs are all STABLE.  Cholesterol is much better than last year.  Would like for her to continue the good work with diet and exercise and we can recheck in a year or so.

## 2020-12-29 NOTE — Progress Notes (Signed)
Chief Complaint:  Cindy Pittman is a 45 y.o. female who presents today for her annual comprehensive physical exam.    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  Symptoms are stable.  Thyroid nodule Follow-up with surgery.  Check TSH.  Recently had thyroid ultrasound done which showed stable nodule.  Allergic rhinitis Continue Astelin and Singulair.  She also takes Claritin.  Dyslipidemia Check labs today.  She has lost 10 to 15 pounds since last year.  Congratulated patient on weight loss and lifestyle modifications.   Body mass index is 27.09 kg/m. / Overweight  BMI Metric Follow Up - 12/29/20 0831       BMI Metric Follow Up-Please document annually   BMI Metric Follow Up Education provided              Preventative Healthcare: Check labs.  Will place order for Cologuard.  Has Pap and mammogram done via gynecology.  Up-to-date on vaccines.  Patient Counseling(The following topics were reviewed and/or handout was given):  -Nutrition: Stressed importance of moderation in sodium/caffeine intake, saturated fat and cholesterol, caloric balance, sufficient intake of fresh fruits, vegetables, and fiber.  -Stressed the importance of regular exercise.   -Substance Abuse: Discussed cessation/primary prevention of tobacco, alcohol, or other drug use; driving or other dangerous activities under the influence; availability of treatment for abuse.   -Injury prevention: Discussed safety belts, safety helmets, smoke detector, smoking near bedding or upholstery.   -Sexuality: Discussed sexually transmitted diseases, partner selection, use of condoms, avoidance of unintended pregnancy and contraceptive alternatives.   -Dental health: Discussed importance of regular tooth brushing, flossing, and dental visits.  -Health maintenance and immunizations reviewed. Please refer to Health  maintenance section.  Return to care in 1 year for next preventative visit.     Subjective:  HPI: She wanted to update [place] about her thyroid nodules. The thyroid nodule on her right side was found benign - discovered last year. The one on her left side was found to be underdeveloped. She plans to continue to follow-up with Dr. Harlow Asa.  Patient has been very busy with summer school and [place] with applications - she is employed at [employer].  She reported doing well with her allergies. Noted that her coughing has not been giving her issues.  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 Her family has not had any cases either - distant cousins have passed away in the recent years from it however. She potentially plans to have a COVID-19 booster in the Fall.  Otherwise, patient has no acute complaints today.   Lifestyle Diet: She is trying to get better with her diet. Encouraged avoid sugars and carbs. Exercise: patient has been doing well with exercise. She is down by 15 lbs compared to last year. Patient is very happy about this.  Depression screen PHQ 2/9 12/29/2020  Decreased Interest 0  Down, Depressed, Hopeless 0  PHQ - 2 Score 0    Health Maintenance Due  Topic Date Due   HIV Screening  Never done   Hepatitis C Screening  Never done     ROS: Per HPI, otherwise a complete review of systems was negative.   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   PCOS (polycystic ovarian syndrome) 07/18/2006   Past Surgical History:  Procedure  Laterality Date   Labial Cystectomy     LUMBAR DISC SURGERY  1990   L4, Dr Hal Neer   Otic Tubes     SEPTOPLASTY     WISDOM TOOTH EXTRACTION      Family History  Problem Relation Age of Onset   Diabetes Father    Gout Father    Heart attack Mother 19       Smoker   Diabetes Mother    Breast cancer Mother    Heart attack Maternal Grandfather 92   Diabetes Cousin    Seizures Paternal Grandfather    Stroke Neg Hx     Medications- reviewed and updated Current Outpatient Medications  Medication Sig Dispense Refill   azelastine (ASTELIN) 0.1 % nasal spray Place 2 sprays into both nostrils 2 (two) times daily. 30 mL 12   budesonide-formoterol (SYMBICORT) 80-4.5 MCG/ACT inhaler TAKE 2 PUFFS BY MOUTH TWICE A DAY 10.2 Inhaler 1   Cholecalciferol (VITAMIN D-3) 25 MCG (1000 UT) CAPS Take by mouth.     ciprofloxacin-dexamethasone (CIPRODEX) OTIC suspension Place 4 drops into the left ear 2 (two) times daily. 7.5 mL 0   Diethylpropion HCl 25 MG TABS Take by mouth in the morning and at bedtime.     Levonorgestrel-Ethinyl Estradiol (AMETHIA) 0.1-0.02 & 0.01 MG tablet      loratadine (CLARITIN) 10 MG tablet Take 10 mg by mouth daily.     albuterol (VENTOLIN HFA) 108 (90 Base) MCG/ACT inhaler Inhale 2 puffs into the lungs as directed. 1-2 puffs every 4-6 hours as needed 8.5 g 11   montelukast (SINGULAIR) 10 MG tablet TAKE 1 TABLET BY MOUTH EVERYDAY AT BEDTIME 90 tablet 3   No current facility-administered medications for this visit.    Allergies-reviewed and updated Allergies  Allergen Reactions   Sulfonamide Derivatives     Rash as child    Social History   Socioeconomic History   Marital status: Single    Spouse name: Not on file   Number of children: Not on file   Years of education: Not on file   Highest education level: Not on file  Occupational History   Not on file  Tobacco Use   Smoking status: Never   Smokeless tobacco: Never  Substance and Sexual Activity   Alcohol  use: Yes    Comment: Rarely   Drug use: No   Sexual activity: Not on file  Other Topics Concern   Not on file  Social History Narrative   Not on file   Social Determinants of Health   Financial Resource Strain: Not on file  Food Insecurity: Not on file  Transportation Needs: Not on file  Physical Activity: Not on file  Stress: Not on file  Social Connections: Not on file        Objective:  Physical Exam: BP 109/69   Pulse (!) 50   Temp 98.4 F (36.9 C) (Temporal)   Ht 5' 6.5" (1.689 m)   Wt 170 lb 6.4 oz (77.3 kg)   LMP 12/19/2020   SpO2 97%   BMI 27.09 kg/m   Body mass index is 27.09 kg/m. Wt Readings from Last 3 Encounters:  12/29/20 170  lb 6.4 oz (77.3 kg)  09/16/20 167 lb (75.8 kg)  08/05/20 167 lb (75.8 kg)   Gen: NAD, resting comfortably HEENT: TMs normal bilaterally. OP clear. Mild thyromegaly on the right noted.  CV: RRR with no murmurs appreciated Pulm: NWOB, CTAB with no crackles, wheezes, or rhonchi GI: Normal bowel sounds present. Soft, Nontender, Nondistended. MSK: no edema, cyanosis, or clubbing noted Skin: warm, dry Neuro: CN2-12 grossly intact. Strength 5/5 in upper and lower extremities. Reflexes symmetric and intact bilaterally.  Psych: Normal affect and thought content     I,Harris Phan,acting as a scribe for Dimas Chyle, MD.,have documented all relevant documentation on the behalf of Dimas Chyle, MD,as directed by  Dimas Chyle, MD while in the presence of Dimas Chyle, MD. I, Dimas Chyle, MD, have reviewed all documentation for this visit. The documentation on 12/29/20 for the exam, diagnosis, procedures, and orders are all accurate and complete.  Algis Greenhouse. Jerline Pain, MD 12/29/2020 8:32 AM

## 2020-12-29 NOTE — Assessment & Plan Note (Signed)
Continue current regimen of Singulair, albuterol, and Symbicort.  Symptoms are stable.

## 2020-12-29 NOTE — Assessment & Plan Note (Signed)
Continue Astelin and Singulair.  She also takes Claritin.

## 2020-12-29 NOTE — Assessment & Plan Note (Signed)
Follow-up with surgery.  Check TSH.  Recently had thyroid ultrasound done which showed stable nodule.

## 2020-12-29 NOTE — Patient Instructions (Signed)
It was very nice to see you today!  We will check blood work today.  We will send prescription for your cologuard.  I will refill your medications.  Please keep up the good work with your diet and exercise.  I will see you back in a year for your next physical.  Come back to see me sooner if needed.  Take care, Dr Jerline Pain  PLEASE NOTE:  If you had any lab tests please let us know if you have not heard back within a few days. You may see your results on mychart before we have a chance to review them but we will give you a call once they are reviewed by [place]. If we ordere we will check d any referrals today, please let us know if you have not heard from their office within the next week.   Please try these tips to maintain a healthy lifestyle:  Eat at least 3 REAL meals and 1-2 snacks per day.  Aim for no more than 5 hours between eating.  If you eat breakfast, please do so within one hour of getting up.   Each meal should contain half fruits/vegetables, one quarter protein, and one quarter carbs (no bigger than a computer mouse)  Cut down on sweet beverages. This includes juice, soda, and sweet tea.   Drink at least 1 glass of water with each meal and aim for at least 8 glasses per day  Exercise at least 150 minutes every week.    Preventive Care 1-37 Years Old, Female Preventive care refers to lifestyle choices and visits with your health care provider that can promote health and wellness. This includes: A yearly physical exam. This is also called an annual wellness visit. Regular dental and eye exams. Immunizations. Screening for certain conditions. Healthy lifestyle choices, such as: Eating a healthy diet. Getting regular exercise. Not using drugs or products that contain nicotine and tobacco. Limiting alcohol use. What can I expect for my preventive care visit? Physical exam Your health care provider will check your: Height and weight. These may be used to calculate your BMI  (body mass index). BMI is a measurement that tells if you are at a healthy weight. Heart rate and blood pressure. Body temperature. Skin for abnormal spots. Counseling Your health care provider may ask you questions about your: Past medical problems. Family's medical history. Alcohol, tobacco, and drug use. Emotional well-being. Home life and relationship well-being. Sexual activity. Diet, exercise, and sleep habits. Work and work Statistician. Access to firearms. Method of birth control. Menstrual cycle. Pregnancy history. What immunizations do I need?  Vaccines are usually given at various ages, according to a schedule. Your health care provider will recommend vaccines for you based on your age, medicalhistory, and lifestyle or other factors, such as travel or where you work. What tests do I need? Blood tests Lipid and cholesterol levels. These may be checked every 5 years, or more often if you are over 50 years old. Hepatitis C test. Hepatitis B test. Screening Lung cancer screening. You may have this screening every year starting at age 46 if you have a 30-pack-year history of smoking and currently smoke or have quit within the past 15 years. Colorectal cancer screening. All adults should have this screening starting at age 71 and continuing until age 86. Your health care provider may recommend screening at age 65 if you are at increased risk. You will have tests every 1-10 years, depending on your results and the type  of screening test. Diabetes screening. This is done by checking your blood sugar (glucose) after you have not eaten for a while (fasting). You may have this done every 1-3 years. Mammogram. This may be done every 1-2 years. Talk with your health care provider about when you should start having regular mammograms. This may depend on whether you have a family history of breast cancer. BRCA-related cancer screening. This may be done if you have a family history of  breast, ovarian, tubal, or peritoneal cancers. Pelvic exam and Pap test. This may be done every 3 years starting at age 19. Starting at age 7, this may be done every 5 years if you have a Pap test in combination with an HPV test. Other tests STD (sexually transmitted disease) testing, if you are at risk. Bone density scan. This is done to screen for osteoporosis. You may have this scan if you are at high risk for osteoporosis. Talk with your health care provider about your test results, treatment options,and if necessary, the need for more tests. Follow these instructions at home: Eating and drinking  Eat a diet that includes fresh fruits and vegetables, whole grains, lean protein, and low-fat dairy products. Take vitamin and mineral supplements as recommended by your health care provider. Do not drink alcohol if: Your health care provider tells you not to drink. You are pregnant, may be pregnant, or are planning to become pregnant. If you drink alcohol: Limit how much you have to 0-1 drink a day. Be aware of how much alcohol is in your drink. In the [place] one drink equals one 12 oz bottle of beer (355 mL), one 5 oz glass of wine (148 mL), or one 1 oz glass of hard liquor (44 mL).  Lifestyle Take daily care of your teeth and gums. Brush your teeth every morning and night with fluoride toothpaste. Floss one time each day. Stay active. Exercise for at least 30 minutes 5 or more days each week. Do not use any products that contain nicotine or tobacco, such as cigarettes, e-cigarettes, and chewing tobacco. If you need help quitting, ask your health care provider. Do not use drugs. If you are sexually active, practice safe sex. Use a condom or other form of protection to prevent STIs (sexually transmitted infections). If you do not wish to become pregnant, use a form of birth control. If you plan to become pregnant, see your health care provider for a prepregnancy visit. If told by your  health care provider, take low-dose aspirin daily starting at age 22. Find healthy ways to cope with stress, such as: Meditation, yoga, or listening to music. Journaling. Talking to a trusted person. Spending time with friends and family. Safety Always wear your seat belt while driving or riding in a vehicle. Do not drive: If you have been drinking alcohol. Do not ride with someone who has been drinking. When you are tired or distracted. While texting. Wear a helmet and other protective equipment during sports activities. If you have firearms in your house, make sure you follow all gun safety procedures. What's next? Visit your health care provider once a year for an annual wellness visit. Ask your health care provider how often you should have your eyes and teeth checked. Stay up to date on all vaccines. This information is not intended to replace advice given to you by your health care provider. Make sure you discuss any questions you have with your healthcare provider. Document Revised: 03/03/2020 Document Reviewed: 02/08/2018 Elsevier  Elsevier Patient Education  2022 Elsevier Inc.  

## 2021-01-01 ENCOUNTER — Encounter: Payer: BC Managed Care – PPO | Admitting: Family Medicine

## 2021-01-05 ENCOUNTER — Encounter: Payer: BC Managed Care – PPO | Admitting: Family Medicine

## 2021-01-11 ENCOUNTER — Encounter: Payer: Self-pay | Admitting: Family Medicine

## 2021-01-11 NOTE — Telephone Encounter (Signed)
Please call pt and schedule virtual visit per Dr. Jerline Pain.

## 2021-01-13 NOTE — Telephone Encounter (Signed)
Patient has been scheduled

## 2021-01-14 ENCOUNTER — Telehealth (INDEPENDENT_AMBULATORY_CARE_PROVIDER_SITE_OTHER): Payer: BC Managed Care – PPO | Admitting: Family Medicine

## 2021-01-14 ENCOUNTER — Encounter: Payer: Self-pay | Admitting: Family Medicine

## 2021-01-14 VITALS — Temp 97.0°F

## 2021-01-14 DIAGNOSIS — U071 COVID-19: Secondary | ICD-10-CM

## 2021-01-14 NOTE — Progress Notes (Signed)
Virtual Visit via Video Note  I connected with Cindy Pittman  on 01/14/21 at 10:20 AM EDT by a video enabled telemedicine application and verified that I am speaking with the correct person using two identifiers.  Location patient: home, [place] Location provider:work or home office Persons participating in the virtual visit: patient, provider  I discussed the limitations of evaluation and management by telemedicine and the availability of in person appointments. The patient expressed understanding and agreed to proceed.   HPI:  Acute telemedicine visit for Covid19: -Onset: about 4-5 days ago -Symptoms include: fatigue, cough, has asthma and had to use her inhaler a few times initially, sore throat, nasal congestion, sneezing, body aches -feels like she is improving -Denies:fever, CP, SOB, NVD, inability to eat/drink/get out of bed -Pertinent past medical history: exercise induced asthma, uses Symbicort rarely when sick -Pertinent medication allergies: Allergies  Allergen Reactions   Sulfonamide Derivatives     Rash as child  -denies any chance of pregnancy -COVID-19 vaccine status: 2 doses + a booster  ROS: See pertinent positives and negatives per HPI.  Past Medical History:  Diagnosis Date   Hyperlipidemia     Ideal LDL goal = < 70 based on NMR Lipoprofile    Past Surgical History:  Procedure Laterality Date   Labial Cystectomy     LUMBAR DISC SURGERY  1990   L4, Dr Hal Neer   Otic Tubes     SEPTOPLASTY     WISDOM TOOTH EXTRACTION       Current Outpatient Medications:    albuterol (VENTOLIN HFA) 108 (90 Base) MCG/ACT inhaler, Inhale 2 puffs into the lungs as directed. 1-2 puffs every 4-6 hours as needed, Disp: 8.5 g, Rfl: 11   azelastine (ASTELIN) 0.1 % nasal spray, Place 2 sprays into both nostrils 2 (two) times daily., Disp: 30 mL, Rfl: 12   budesonide-formoterol (SYMBICORT) 80-4.5 MCG/ACT inhaler, TAKE 2 PUFFS BY MOUTH TWICE A DAY, Disp: 10.2 Inhaler, Rfl: 1    Cholecalciferol (VITAMIN D-3) 25 MCG (1000 UT) CAPS, Take by mouth., Disp: , Rfl:    ciprofloxacin-dexamethasone (CIPRODEX) OTIC suspension, Place 4 drops into the left ear 2 (two) times daily., Disp: 7.5 mL, Rfl: 0   Diethylpropion HCl 25 MG TABS, Take by mouth in the morning and at bedtime., Disp: , Rfl:    Levonorgestrel-Ethinyl Estradiol (AMETHIA) 0.1-0.02 & 0.01 MG tablet, , Disp: , Rfl:    loratadine (CLARITIN) 10 MG tablet, Take 10 mg by mouth daily., Disp: , Rfl:    montelukast (SINGULAIR) 10 MG tablet, TAKE 1 TABLET BY MOUTH EVERYDAY AT BEDTIME, Disp: 90 tablet, Rfl: 3  EXAM:  VITALS per patient if applicable:  GENERAL: alert, oriented, appears well and in no acute distress  HEENT: atraumatic, conjunttiva clear, no obvious abnormalities on inspection of external nose and ears  NECK: normal movements of the head and neck  LUNGS: on inspection no signs of respiratory distress, breathing rate appears normal, no obvious gross SOB, gasping or wheezing  CV: no obvious cyanosis  MS: moves all visible extremities without noticeable abnormality  PSYCH/NEURO: pleasant and cooperative, no obvious depression or anxiety, speech and thought processing grossly intact  ASSESSMENT AND PLAN:  Discussed the following assessment and plan:  COVID-19   Discussed treatment options (infusions and oral options and risk of drug interactions), ideal treatment window, potential complications, isolation and precautions for COVID-19.  After lengthy discussion, the patient declined referral for Covid outpatient treatment at this time. Symptomatic care measures summarized in patient instructions.  Work/School slipped offered: provided in patient instructions    Advised to seek prompt in person care if worsening, new symptoms arise, or if is not improving with treatment. Discussed options for inperson care if PCP office not available. Did let this patient know that I only do telemedicine on Tuesdays and  Thursdays for [place]. Advised to schedule follow up visit with PCP or [facility] if any further questions or concerns to avoid delays in care.   I discussed the assessment and treatment plan with the patient. The patient was provided an opportunity to ask questions and all were answered. The patient agreed with the plan and demonstrated an understanding of the instructions.     Lucretia Kern, DO

## 2021-01-14 NOTE — Patient Instructions (Addendum)
   ---------------------------------------------------------------------------------------------------------------------------      WORK SLIP:  Patient Cindy Pittman,  11/18/1975, was seen for a medical visit today, 01/14/21 . Please excuse from work for a COVID like illness. We advise 10 days minimum from the onset of symptoms (01/09/21) PLUS 1 day of no fever and improved symptoms. Will defer to employer for a sooner return to work if symptoms have resolved, it is greater than 5 days since the positive test and the patient can wear a high-quality, tight fitting mask such as N95 or KN95 at all times for an additional 5 days. Would also suggest COVID19 antigen testing is negative prior to return.  Sincerely: E-signature: Dr. Colin Benton, DO [place] Ph: [phone]   ------------------------------------------------------------------------------------------------------------------------------   HOME CARE TIPS:  -can use tylenol or aleve if needed for fevers, aches and pains per instructions  -can use nasal saline a few times per day if you have nasal congestion; sometimes  a short course of Afrin nasal spray for 3 days can help with symptoms as well  -stay hydrated, drink plenty of fluids and eat small healthy meals - avoid dairy  -can take 1000 IU (6mg) Vit D3 and 100-500 mg of Vit C daily per instructions  -If the Covid test is positive, check out the [facility] for more information on home care, transmission and treatment for COVID19  -follow up with your doctor in 2-3 days unless improving and feeling better  -stay home while sick, except to seek medical care. If you have COVID19, ideally it would be best to stay home for a full 10 days since the onset of symptoms PLUS one day of no fever and feeling better. Wear a good mask that fits snugly (such as N95 or KN95) if around others to reduce the risk of transmission.  It was nice to meet you today,  and I really hope you are feeling better soon. I help [facility] out with telemedicine visits on Tuesdays and Thursdays and am available for visits on those days. If you have any concerns or questions following this visit please schedule a follow up visit with your Primary Care doctor or seek care at a local urgent care clinic to avoid delays in care.    Seek in person care or schedule a follow up video visit promptly if your symptoms worsen, new concerns arise or you are not improving with treatment. Call 911 and/or seek emergency care if your symptoms are severe or life threatening.

## 2021-01-15 LAB — COLOGUARD: Cologuard: NEGATIVE

## 2021-01-18 NOTE — Progress Notes (Signed)
Please inform patient of the following:  Cologuard test is negative.

## 2021-05-03 ENCOUNTER — Other Ambulatory Visit: Payer: Self-pay

## 2021-05-03 ENCOUNTER — Ambulatory Visit: Payer: BC Managed Care – PPO | Admitting: Physician Assistant

## 2021-05-03 VITALS — BP 112/74 | HR 57 | Temp 98.3°F | Ht 66.5 in | Wt 175.4 lb

## 2021-05-03 DIAGNOSIS — J0100 Acute maxillary sinusitis, unspecified: Secondary | ICD-10-CM | POA: Diagnosis not present

## 2021-05-03 MED ORDER — AZITHROMYCIN 250 MG PO TABS: ORAL_TABLET | ORAL | 0 refills | Status: DC

## 2021-05-03 NOTE — Progress Notes (Signed)
Subjective:    Patient ID: Wynelle Beckmann, female    DOB: Mar 30, 1976, 45 y.o.   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    Past Medical History:  Diagnosis Date   Hyperlipidemia     Ideal LDL goal = < 70 based on NMR Lipoprofile    Past Surgical History:  Procedure Laterality Date   Labial Cystectomy     LUMBAR DISC SURGERY  1990   L4, Dr Hal Neer   Otic Tubes     SEPTOPLASTY     WISDOM TOOTH EXTRACTION      Family History  Problem Relation Age of Onset   Diabetes Father    Gout Father    Heart attack Mother 59       Smoker   Diabetes Mother    Breast cancer Mother    Heart attack Maternal Grandfather 52   Diabetes Cousin    Seizures Paternal Grandfather    Stroke Neg Hx     Social History   Tobacco Use   Smoking status: Never   Smokeless tobacco: Never  Substance Use Topics   Alcohol use: Yes    Comment: Rarely   Drug use: No     Allergies  Allergen Reactions   Sulfonamide Derivatives     Rash as child    Review of Systems NEGATIVE UNLESS OTHERWISE INDICATED IN HPI      Objective:     BP 112/74   Pulse (!) 57   Temp 98.3 F (36.8 C)   Ht 5' 6.5" (1.689 m)   Wt 175 lb 6.1 oz (79.6 kg)   SpO2 99%   BMI 27.88 kg/m   Wt Readings from Last 3 Encounters:  05/03/21 175 lb 6.1 oz (79.6 kg)  12/29/20 170 lb 6.4 oz (77.3 kg)  09/16/20 167 lb (75.8 kg)    BP Readings from Last 3 Encounters:  05/03/21 112/74  12/29/20 109/69  09/16/20 118/72     Physical Exam Vitals and nursing note reviewed.  Constitutional:      Appearance: Normal  appearance. She is normal weight. She is not toxic-appearing.  HENT:     Head: Normocephalic and atraumatic.     Right Ear: Tympanic membrane, ear canal and external ear normal.     Left Ear: Tympanic membrane, ear canal and external ear normal.     Nose:     Right Sinus: Maxillary sinus tenderness present.     Left Sinus: Maxillary sinus tenderness present.     Mouth/Throat:     Mouth: Mucous membranes are moist.  Eyes:     Extraocular Movements: Extraocular movements intact.     Conjunctiva/sclera: Conjunctivae normal.     Pupils: Pupils are equal, round, and reactive to light.  Cardiovascular:     Rate and Rhythm: Normal rate and regular rhythm.     Pulses: Normal pulses.     Heart sounds: Normal heart sounds.  Pulmonary:     Effort: Pulmonary effort is normal.     Breath sounds: Normal breath sounds.  Musculoskeletal:        General: Normal range of motion.  Cervical back: Normal range of motion and neck supple.  Skin:    General: Skin is warm and dry.  Neurological:     General: No focal deficit present.     Mental Status: She is alert and oriented to person, place, and time.  Psychiatric:        Mood and Affect: Mood normal.        Behavior: Behavior normal.        Thought Content: Thought content normal.        Judgment: Judgment normal.       Assessment & Plan:   Problem List Items Addressed This Visit   None Visit Diagnoses     Acute maxillary sinusitis, recurrence not specified    -  Primary   Relevant Medications   azithromycin (ZITHROMAX Z-PAK) 250 MG tablet        Meds ordered this encounter  Medications   azithromycin (ZITHROMAX Z-PAK) 250 MG tablet    Sig: Take two tablets on day one, followed by one tablet daily for the next four days.    Dispense:  6 tablet    Refill:  0   1. Acute maxillary sinusitis, recurrence not specified Persistent symptoms >10 days despite conservative efforts at home. Will Rx Z-pak at this time, take with food.  Cautioned on antibiotic use and possible side effects. Advised nasal saline, humidifier, and pushing fluids. Sudafed OTC for added relief. Call if worse or no improvement.     Keddrick Wyne M Ayse Mccartin, PA-C

## 2021-05-09 ENCOUNTER — Encounter: Payer: Self-pay | Admitting: Physician Assistant

## 2021-05-11 ENCOUNTER — Other Ambulatory Visit: Payer: Self-pay

## 2021-05-11 ENCOUNTER — Emergency Department
Admission: RE | Admit: 2021-05-11 | Discharge: 2021-05-11 | Disposition: A | Discharge status: Home / Self Care | Payer: BC Managed Care – PPO | Source: Ambulatory Visit

## 2021-05-11 VITALS — BP 152/87 | HR 56 | Temp 98.7°F | Resp 15 | Wt 175.4 lb

## 2021-05-11 DIAGNOSIS — H6692 Otitis media, unspecified, left ear: Secondary | ICD-10-CM | POA: Diagnosis not present

## 2021-05-11 HISTORY — DX: Personal history of other diseases of the nervous system and sense organs: Z86.69

## 2021-05-11 MED ORDER — AMOXICILLIN-POT CLAVULANATE 875-125 MG PO TABS: 1.0000 | ORAL_TABLET | Freq: Two times a day (BID) | ORAL | 0 refills | Status: DC

## 2021-05-11 NOTE — ED Provider Notes (Signed)
[facility]    CSN: [id number] Arrival date & time: 05/11/21  1416      History   Chief Complaint Chief Complaint  Patient presents with   Otalgia    left    HPI Cindy Pittman is a 45 y.o. female.   HPI 45 year old female presents with left ear pain for 7 to 8 days.  Patient reports was evaluated last week for left ear pain and prescribed a Z-Pak which she completed.  Patient reports left ear pain has worsened since.  Patient reports history of ear infection as an adult and had TP tubes as child.  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      Home Medications    Prior to Admission medications   Medication Sig Start Date End Date Taking? Authorizing Provider  amoxicillin-clavulanate (AUGMENTIN) 875-125 MG tablet Take 1 tablet by mouth every 12 (twelve) hours. 05/11/21  Yes Eliezer Lofts, FNP  albuterol (VENTOLIN HFA) 108 (90 Base) MCG/ACT inhaler Inhale 2 puffs into the lungs as directed. 1-2 puffs every 4-6 hours as needed 12/29/20   Vivi Barrack, MD  azelastine (ASTELIN) 0.1 % nasal spray Place 2 sprays into both nostrils 2 (two) times daily. 12/23/19   Vivi Barrack, MD  azithromycin (ZITHROMAX Z-PAK) 250 MG tablet Take two tablets on day one, followed by one tablet daily for the next four days. Patient not taking: Reported on  05/11/2021 05/03/21   Allwardt, Randa Evens, PA-C  budesonide-formoterol (SYMBICORT) 80-4.5 MCG/ACT inhaler TAKE 2 PUFFS BY MOUTH TWICE A DAY 12/23/19   Vivi Barrack, MD  Cholecalciferol (VITAMIN D-3) 25 MCG (1000 UT) CAPS Take by mouth.    [provider]  Diethylpropion HCl 25 MG TABS Take by mouth in the morning and at bedtime.    [provider]  Levonorgestrel-Ethinyl Estradiol (AMETHIA) 0.1-0.02 & 0.01 MG tablet  08/25/18   [provider]  loratadine (CLARITIN) 10 MG tablet Take 10 mg by mouth daily.    [provider]  montelukast (SINGULAIR) 10 MG tablet TAKE 1 TABLET BY MOUTH EVERYDAY AT BEDTIME 12/29/20   Vivi Barrack, MD    Family History Family History  Problem Relation Age of Onset   Heart attack Mother 84       Smoker   Diabetes Mother    Breast cancer Mother    Diabetes Father    Gout Father    Heart attack Maternal Grandfather 102   Seizures Paternal Grandfather    Diabetes Cousin    Stroke Neg Hx     Social History Social History   Tobacco Use   Smoking status: Never   Smokeless tobacco: Never  Substance Use Topics   Alcohol use: Yes  Comment: Rarely   Drug use: No     Allergies   Sulfonamide derivatives   Review of Systems Review of Systems  HENT:  Positive for ear pain.   All other systems reviewed and are negative.   Physical Exam Triage Vital Signs ED Triage Vitals  Enc Vitals Group     BP 05/11/21 1433 (!) 152/87     Pulse Rate 05/11/21 1433 (!) 56     Resp 05/11/21 1433 15     Temp 05/11/21 1433 98.7 F (37.1 C)     Temp Source 05/11/21 1433 Oral     SpO2 05/11/21 1433 97 %     Weight 05/11/21 1435 175 lb 6 oz (79.5 kg)     Height --      Head Circumference --      Peak Flow --      Pain Score 05/11/21 1434 3     Pain Loc --      Pain Edu? --      Excl. in [place]? --    No data found.  Updated Vital Signs BP (!) 152/87 (BP Location: Left Arm)   Pulse (!) 56   Temp 98.7 F (37.1 C) (Oral)    Resp 15   Wt 175 lb 6 oz (79.5 kg)   SpO2 97%   BMI 27.88 kg/m    Physical Exam Vitals and nursing note reviewed.  Constitutional:      General: She is not in acute distress.    Appearance: Normal appearance. She is normal weight. She is not ill-appearing.  HENT:     Head: Normocephalic and atraumatic.     Right Ear: Hearing, tympanic membrane, ear canal and external ear normal.     Left Ear: Hearing, ear canal and external ear normal. Tympanic membrane is erythematous and retracted.     Mouth/Throat:     Mouth: Mucous membranes are moist.     Pharynx: Oropharynx is clear.  Eyes:     Extraocular Movements: Extraocular movements intact.     Conjunctiva/sclera: Conjunctivae normal.     Pupils: Pupils are equal, round, and reactive to light.  Cardiovascular:     Rate and Rhythm: Normal rate and regular rhythm.     Pulses: Normal pulses.     Heart sounds: Normal heart sounds.  Pulmonary:     Effort: Pulmonary effort is normal.     Breath sounds: Normal breath sounds.  Musculoskeletal:        General: Normal range of motion.     Cervical back: Normal range of motion and neck supple.  Skin:    General: Skin is warm and dry.  Neurological:     General: No focal deficit present.     Mental Status: She is alert and oriented to person, place, and time. Mental status is at baseline.     UC Treatments / Results  Labs (all labs ordered are listed, but only abnormal results are displayed) Labs Reviewed - No data to display  EKG   Radiology No results found.  Procedures Procedures (including critical care time)  Medications Ordered in UC Medications - No data to display  Initial Impression / Assessment and Plan / UC Course  I have reviewed the triage vital signs and the nursing notes.  Pertinent labs & imaging results that were available during my care of the patient were reviewed by me and considered in my medical decision making (see chart for details).    MDM: 1.  Acute left otitis media-Rx'd Augmentin. Advised patient to take medication as directed with food to completion.  Encouraged patient to increase daily water intake while taking this medication.  Patient discharged home, hemodynamically stable. Final Clinical Impressions(s) / UC Diagnoses   Final diagnoses:  Acute left otitis media     Discharge Instructions      Advised patient to take medication as directed with food to completion.  Encouraged patient to increase daily water intake while taking this medication.     ED Prescriptions     Medication Sig Dispense Auth. Provider   amoxicillin-clavulanate (AUGMENTIN) 875-125 MG tablet Take 1 tablet by mouth every 12 (twelve) hours. 14 tablet Eliezer Lofts, FNP      PDMP not reviewed this encounter.   Eliezer Lofts, [place] 05/11/21 1541

## 2021-05-11 NOTE — ED Triage Notes (Signed)
Saw a PA last Monday for left ear pain  Given a Zpak - completed  Left ear pain remains or is worse  Denies fever Ear infections as an adult w/ ear tubes Had a flu/covid vaccine on 04/20/21

## 2021-05-11 NOTE — Discharge Instructions (Addendum)
Advised patient to take medication as directed with food to completion.  Encouraged patient to increase daily water intake while taking this medication.

## 2021-10-15 ENCOUNTER — Ambulatory Visit
Admission: RE | Admit: 2021-10-15 | Discharge: 2021-10-15 | Disposition: A | Discharge status: Home / Self Care | Payer: BC Managed Care – PPO | Source: Ambulatory Visit | Attending: Emergency Medicine | Admitting: Emergency Medicine

## 2021-10-15 VITALS — BP 133/65 | HR 51 | Temp 98.3°F | Resp 18

## 2021-10-15 DIAGNOSIS — R058 Other specified cough: Secondary | ICD-10-CM | POA: Insufficient documentation

## 2021-10-15 DIAGNOSIS — J45909 Unspecified asthma, uncomplicated: Secondary | ICD-10-CM | POA: Diagnosis present

## 2021-10-15 DIAGNOSIS — J302 Other seasonal allergic rhinitis: Secondary | ICD-10-CM | POA: Diagnosis present

## 2021-10-15 DIAGNOSIS — J029 Acute pharyngitis, unspecified: Secondary | ICD-10-CM

## 2021-10-15 DIAGNOSIS — J3089 Other allergic rhinitis: Secondary | ICD-10-CM | POA: Diagnosis present

## 2021-10-15 LAB — POCT RAPID STREP A (OFFICE): Rapid Strep A Screen: NEGATIVE

## 2021-10-15 MED ORDER — FEXOFENADINE HCL 180 MG PO TABS: 180.0000 mg | ORAL_TABLET | Freq: Every day | ORAL | 1 refills | Status: DC

## 2021-10-15 MED ORDER — FLUTICASONE PROPIONATE 50 MCG/ACT NA SUSP: 1.0000 | Freq: Every day | NASAL | 1 refills | Status: DC

## 2021-10-15 MED ORDER — BUDESONIDE-FORMOTEROL FUMARATE 80-4.5 MCG/ACT IN AERO: INHALATION_SPRAY | RESPIRATORY_TRACT | 1 refills | Status: DC

## 2021-10-15 MED ORDER — PROMETHAZINE-DM 6.25-15 MG/5ML PO SYRP: 5.0000 mL | ORAL_SOLUTION | Freq: Four times a day (QID) | ORAL | 0 refills | Status: DC | PRN

## 2021-10-15 NOTE — ED Provider Notes (Signed)
?[facility] ? ? ? ?CSN: [id number] ?Arrival date & time: 10/15/21  0820 ?  ? ?HISTORY  ? ?Chief Complaint  ?Patient presents with  ? Cough  ?  I began feeling a tickle in my throat on Saturday. When I woke up Sunday, I didn't feel well and I've had a cough. My main issue is cough and some congestion. My ears also feel as if they have fluid. - Entered by patient  ? Nasal Congestion  ? Ear Fullness  ? ?HPI ?Cindy Pittman is a 46 y.o. female. Pt states she had a tickle in her throat on Saturday. Sunday she began having a cough, congestion and felt like there is fluid in her ears.  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  Cough is nonproductive, worse at night.  Exercise-induced asthma.  Not using inhalers.  Not taking allergy medications.  Denies fever, aches, chills, nausea, vomiting, diarrhea.  Denies known sick contacts. ? ?The history is provided by the patient.  ?Past Medical History:  ?Diagnosis Date  ? History of ear infections   ? Hyperlipidemia   ?  Ideal LDL goal = < 70 based on NMR Lipoprofile  ? ?Patient Active Problem List  ? Diagnosis Date Noted  ? Thyroid nodule 12/29/2020  ? Overweight 12/23/2019  ? Allergic rhinitis 12/11/2018  ? Dyslipidemia 10/12/2018  ? History of nephrolithiasis 10/12/2018  ? 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 PCOS (polycystic ovarian syndrome) 07/18/2006  ? ?Past Surgical History:  ?Procedure Laterality Date  ? Labial Cystectomy    ? [place] SURGERY  1990  ? L4, Dr Hal Neer  ? Otic Tubes    ? SEPTOPLASTY    ? WISDOM TOOTH EXTRACTION    ? ?OB History   ?No obstetric history on file. ?  ? ?Home Medications   ? ?Prior to Admission medications   ?Medication Sig Start Date End Date Taking? Authorizing Provider  ?albuterol (VENTOLIN HFA) 108 (90 Base) MCG/ACT inhaler Inhale 2 puffs into the lungs as directed. 1-2 puffs every 4-6 hours as needed 12/29/20   Vivi Barrack, MD  ?azelastine (ASTELIN) 0.1 % nasal spray Place 2 sprays into both  nostrils 2 (two) times daily. 12/23/19   Vivi Barrack, MD  ?budesonide-formoterol [facility]) 80-4.5 MCG/ACT inhaler TAKE 2 PUFFS BY MOUTH TWICE A DAY 12/23/19   Vivi Barrack, MD  ?Cholecalciferol (VITAMIN D-3) 25 MCG (1000 UT) CAPS Take by mouth.    [provider]  ?Diethylpropion HCl 25 MG TABS Take by mouth in the morning and at bedtime.    [provider]  ?Levonorgestrel-Ethinyl Estradiol (AMETHIA) 0.1-0.02 & 0.01 MG tablet  08/25/18   [provider]  ?loratadine (CLARITIN) 10 MG tablet Take 10 mg by mouth daily.    [provider]  ?montelukast (SINGULAIR) 10 MG tablet TAKE 1 TABLET BY MOUTH EVERYDAY AT BEDTIME 12/29/20   Vivi Barrack, MD  ? ?Family History ?Family History  ?Problem Relation Age of Onset  ? Heart attack Mother 40  ?     Smoker  ? Diabetes Mother   ? Breast cancer Mother   ? Diabetes Father   ? Gout Father   ? Heart attack Maternal Grandfather 54  ? Seizures Paternal Grandfather   ? Diabetes Cousin   ? Stroke Neg Hx   ? ?Social History ?Social History  ? ?Tobacco Use  ? Smoking status: Never  ? Smokeless tobacco: Never  ?Substance Use Topics  ?  Alcohol use: Yes  ?  Comment: Rarely  ? Drug use: No  ? ?Allergies   ?Sulfonamide derivatives ? ?Review of Systems ?Review of Systems ?Pertinent findings noted in history of present illness.  ? ?Physical Exam ?Triage Vital Signs ?ED Triage Vitals  ?Enc Vitals Group  ?   BP 04/09/21 0827 (!) 147/82  ?   Pulse Rate 04/09/21 0827 72  ?   Resp 04/09/21 0827 18  ?   Temp 04/09/21 0827 98.3 ?F (36.8 ?C)  ?   Temp Source 04/09/21 0827 Oral  ?   SpO2 04/09/21 0827 98 %  ?   Weight --   ?   Height --   ?   Head Circumference --   ?   Peak Flow --   ?   Pain Score 04/09/21 0826 5  ?   Pain Loc --   ?   Pain Edu? --   ?   Excl. in [place]? --   ?No data found. ? ?Updated Vital Signs ?BP 133/65 (BP Location: Right Arm)   Pulse (!) 51   Temp 98.3 ?F (36.8 ?C) (Oral)   Resp 18   LMP 09/11/2021   SpO2 97%  ? ?Physical  Exam ?Vitals and nursing note reviewed.  ?Constitutional:   ?   General: She is not in acute distress. ?   Appearance: Normal appearance. She is not ill-appearing.  ?HENT:  ?   Head: Normocephalic and atraumatic.  ?   Salivary Glands: Right salivary gland is not diffusely enlarged or tender. Left salivary gland is not diffusely enlarged or tender.  ?   Right Ear: Ear canal and external ear normal. No drainage. A middle ear effusion is present. There is no impacted cerumen. Tympanic membrane is bulging. Tympanic membrane is not injected or erythematous.  ?   Left Ear: Ear canal and external ear normal. No drainage. A middle ear effusion is present. There is no impacted cerumen. Tympanic membrane is bulging. Tympanic membrane is not injected or erythematous.  ?   Ears:  ?   Comments: Bilateral EACs normal, both TMs bulging with clear fluid ?   Nose: Rhinorrhea present. No nasal deformity, septal deviation, signs of injury, nasal tenderness, mucosal edema or congestion. Rhinorrhea is clear.  ?   Right Nostril: Occlusion present. No foreign body, epistaxis or septal hematoma.  ?   Left Nostril: Occlusion present. No foreign body, epistaxis or septal hematoma.  ?   Right Turbinates: Enlarged, swollen and pale.  ?   Left Turbinates: Enlarged, swollen and pale.  ?   Right Sinus: No maxillary sinus tenderness or frontal sinus tenderness.  ?   Left Sinus: No maxillary sinus tenderness or frontal sinus tenderness.  ?   Mouth/Throat:  ?   Lips: Pink. No lesions.  ?   Mouth: Mucous membranes are moist. No oral lesions.  ?   Pharynx: Oropharynx is clear. Uvula midline. Posterior oropharyngeal erythema present. No pharyngeal swelling, oropharyngeal exudate or uvula swelling.  ?   Tonsils: No tonsillar exudate. 2+ on the right. 2+ on the left.  ?   Comments: Postnasal drip ?Eyes:  ?   General: Lids are normal.     ?   Right eye: No discharge.     ?   Left eye: No discharge.  ?   Extraocular Movements: Extraocular movements intact.   ?   Conjunctiva/sclera: Conjunctivae normal.  ?   Right eye: Right conjunctiva is not injected.  ?  Left eye: Left conjunctiva is not injected.  ?Neck:  ?   Trachea: Trachea and phonation normal.  ?Cardiovascular:  ?   Rate and Rhythm: Normal rate and regular rhythm.  ?   Pulses: Normal pulses.  ?   Heart sounds: Normal heart sounds. No murmur heard. ?  No friction rub. No gallop.  ?Pulmonary:  ?   Effort: Pulmonary effort is normal. No accessory muscle usage, prolonged expiration or respiratory distress.  ?   Breath sounds: Normal breath sounds. No stridor, decreased air movement or transmitted upper airway sounds. No decreased breath sounds, wheezing, rhonchi or rales.  ?Chest:  ?   Chest wall: No tenderness.  ?Musculoskeletal:     ?   General: Normal range of motion.  ?   Cervical back: Normal range of motion and neck supple. Normal range of motion.  ?Lymphadenopathy:  ?   Cervical: No cervical adenopathy.  ?Skin: ?   General: Skin is warm and dry.  ?   Findings: No erythema or rash.  ?Neurological:  ?   General: No focal deficit present.  ?   Mental Status: She is alert and oriented to person, place, and time.  ?Psychiatric:     ?   Mood and Affect: Mood normal.     ?   Behavior: Behavior normal.  ? ? ?Visual Acuity ?Right Eye Distance:   ?Left Eye Distance:   ?Bilateral Distance:   ? ?Right Eye Near:   ?Left Eye Near:    ?Bilateral Near:    ? ?UC Couse / Diagnostics / Procedures:  ?  ?EKG ? ?Radiology ?No results found. ? ?Procedures ?Procedures (including critical care time) ? ?UC Diagnoses / Final Clinical Impressions(s)   ?I have reviewed the triage vital signs and the nursing notes. ? ?Pertinent labs & imaging results that were available during my care of the patient were reviewed by me and considered in my medical decision making (see chart for details).   ?Final diagnoses:  ?Acute pharyngitis, unspecified etiology  ?Perennial allergic rhinitis with seasonal variation  ?Reactive airway disease without  complication, unspecified asthma severity, unspecified whether persistent  ?Nonproductive cough  ? ?Rapid strep test today is negative, throat culture will be performed per protocol, antibiotics will be provided as

## 2021-10-15 NOTE — ED Triage Notes (Signed)
Pt states she had a tickle in her throat on Saturday. Sunday she began having a cough, congestion and felt like there is fluid in her ears. ?

## 2021-10-15 NOTE — Discharge Instructions (Addendum)
Your symptoms and physical exam findings are concerning for a viral respiratory infection.   ?  ? ?Your strep test today is negative.  Streptococcal throat culture will be performed per our protocol.  The result of your throat culture will be posted to your MyChart once it is complete, this typically takes 3 to 5 days.   ?  ?If your streptococcal throat culture is positive, you will be contacted by phone and antibiotics will be prescribed for you.   ?  ?After 24 hours of taking antibiotics, please discard your toothbrush as well as any other oral devices that you are currently using and replace them with new ones to avoid reinfection. ?  ?Once you have been on antibiotics for a full 24 hours, you are no longer considered contagious.   I have provided you with a note to return to work. ?  ?Even if you are feeling better, please make sure that you finish the full 10-day course and do not skip any doses.  Failure to complete a full course of antibiotics for strep throat can result in worsening infection that may require longer treatment with stronger antibiotics. ?   ? ?Your symptoms and my physical exam findings are concerning for exacerbation of your underlying allergies.  It is important that you begin your allergy regimen now and are consistent with taking allergy medications exactly as prescribed.  Allergy medications are preventative and therefore only work well when they are taken daily, not "as needed". ?  ?Please see the list below for recommended medications, dosages and frequencies:   ?  ?Allegra (fexofenadine): This is an excellent second-generation antihistamine that helps to reduce respiratory inflammatory response to environmental allergens.  This medication is not known to cause daytime sleepiness so it can be taken in the daytime.  If you find that it does make you sleepy, please feel free to take it at bedtime.  I have provided you with a prescription.  Please discontinue Claritin. ?  ?Singulair  (montelukast): This is a mast cell stabilizer that works well with antihistamines.  Mast cells are responsible for stimulating histamine production so you can imagine that if we can reduce the activity of your mast cells, then fewer histamines will be produced and inflammation caused by allergy exposure will be significantly reduced.  I recommend that you take this medication at the same time you take your antihistamine. ?  ?Flonase (fluticasone): This is a steroid nasal spray that you use once daily, 1 spray in each nare.  This medication does not work well if you decide to use it only used as you feel you need to, it works best used on a daily basis.  After 3 to 5 days of use, you will notice significant reduction of the inflammation and mucus production that is currently being caused by exposure to allergens, whether seasonal or environmental.  The most common side effect of this medication is nosebleeds.  If you experience a nosebleed, please discontinue use for 1 week, then feel free to resume.  I have provided you with a prescription.  ?  ?Advil, Motrin (ibuprofen): This is a good anti-inflammatory medication which not only addresses aches, pains but also significantly reduces soft tissue inflammation of the upper airways that causes sinus and nasal congestion as well as inflammation of the lower airways which makes you feel like your breathing is constricted or your cough feel tight.  I recommend that you take between 400 mg every 8 hours as needed.    ?  ? ?  Your symptoms and my physical exam findings are concerning for reactive airway disease with an acute exacerbation.  Reactive airways disease is when your lower airway becomes inflamed due to viral infection, bacterial infection or allergy exposure. ?   ?Please see the list below for recommended medications, dosages and frequencies:   ? ?Symbicort (budesonide and formoterol): Please inhale 2 puffs twice daily.  This inhaled medication contains a  corticosteroid and long-acting form of albuterol.  The inhaled steroid and this medication  is not absorbed into the body and will not cause side effects such as increased blood sugar levels, irritability, sleeplessness or weight gain.  Inhaled corticosteroid are sort of like topical steroid creams but, as you can imagine, it is not practical to attempt to rub a steroid cream inside of your lungs.  The long-acting albuterol works similarly to the short acting albuterol found in your rescue inhaler but provides 24-hour relaxation of the smooth muscles that open and constrict your airways; your short acting rescue inhaler can only provide for a few hours this benefit for a few hours.  Please feel free to continue using your short acting rescue inhaler as often as needed throughout the day for shortness of breath, wheezing, and cough.  I provided you with a renewal of this prescription. ?  ?ProAir, Ventolin, Proventil (albuterol): This inhaled medication contains a short acting beta agonist bronchodilator.  This medication works on the smooth muscle that opens and constricts of your airways by relaxing the muscle.  The result of relaxation of the smooth muscle is increased air movement and improved work of breathing.  This is a short acting medication that can be used every 4-6 hours as needed for increased work of breathing, shortness of breath, wheezing and excessive coughing.   ?  ? ?Please see the list below for other recommended medications, dosages and frequencies to provide relief of your current symptoms:   ?  ?Promethazine DM: Promethazine is both a nasal decongestant and an antinausea medication that makes most patients feel fairly sleepy.  The DM is dextromethorphan, a cough suppressant found in many over-the-counter cough medications.  Please take 5 mL before bedtime to minimize your cough which will help you sleep better.  I have provided you with a prescription for this medication.    ?  ?Chloraseptic  Throat Spray: Spray 5 sprays into affected area every 2 hours, hold for 15 seconds and either swallow or spit it out.  This is a excellent numbing medication because it is a spray, you can put it right where you needed and so sucking on a lozenge and numbing your entire mouth.    ?  ?Please follow-up within the next 5-7 days either with your primary care provider or urgent care if your symptoms do not resolve.  If you do not have a primary care provider, we will assist you in finding one. ?  ?Thank you for visiting urgent care today.  We appreciate the opportunity to participate in your care. ? ?

## 2021-10-17 LAB — CULTURE, GROUP A STREP (THRC)

## 2022-01-07 ENCOUNTER — Ambulatory Visit (INDEPENDENT_AMBULATORY_CARE_PROVIDER_SITE_OTHER): Payer: BC Managed Care – PPO | Admitting: Family Medicine

## 2022-01-07 VITALS — BP 119/75 | HR 48 | Temp 98.0°F | Ht 66.5 in | Wt 178.6 lb

## 2022-01-07 DIAGNOSIS — L309 Dermatitis, unspecified: Secondary | ICD-10-CM

## 2022-01-07 DIAGNOSIS — E663 Overweight: Secondary | ICD-10-CM | POA: Diagnosis not present

## 2022-01-07 DIAGNOSIS — J309 Allergic rhinitis, unspecified: Secondary | ICD-10-CM | POA: Diagnosis not present

## 2022-01-07 DIAGNOSIS — Z1159 Encounter for screening for other viral diseases: Secondary | ICD-10-CM

## 2022-01-07 DIAGNOSIS — E785 Hyperlipidemia, unspecified: Secondary | ICD-10-CM

## 2022-01-07 DIAGNOSIS — Z114 Encounter for screening for human immunodeficiency virus [HIV]: Secondary | ICD-10-CM

## 2022-01-07 DIAGNOSIS — Z0001 Encounter for general adult medical examination with abnormal findings: Secondary | ICD-10-CM | POA: Diagnosis not present

## 2022-01-07 DIAGNOSIS — R739 Hyperglycemia, unspecified: Secondary | ICD-10-CM | POA: Diagnosis not present

## 2022-01-07 DIAGNOSIS — R5383 Other fatigue: Secondary | ICD-10-CM

## 2022-01-07 DIAGNOSIS — J45990 Exercise induced bronchospasm: Secondary | ICD-10-CM | POA: Diagnosis not present

## 2022-01-07 MED ORDER — TRIAMCINOLONE ACETONIDE 0.5 % EX CREA: 1.0000 | TOPICAL_CREAM | Freq: Three times a day (TID) | CUTANEOUS | 3 refills | Status: DC

## 2022-01-07 MED ORDER — MONTELUKAST SODIUM 10 MG PO TABS: ORAL_TABLET | ORAL | 3 refills | Status: DC

## 2022-01-07 MED ORDER — FLUTICASONE PROPIONATE 50 MCG/ACT NA SUSP: 1.0000 | Freq: Every day | NASAL | 1 refills | Status: DC

## 2022-01-07 MED ORDER — AZELASTINE HCL 0.1 % NA SOLN
2.0000 | Freq: Two times a day (BID) | NASAL | 12 refills | Status: DC
Start: 2022-01-07 — End: 2023-04-06

## 2022-01-07 MED ORDER — WEGOVY 0.25 MG/0.5ML ~~LOC~~ SOAJ: 0.2500 mg | SUBCUTANEOUS | 0 refills | Status: DC

## 2022-01-07 NOTE — Patient Instructions (Signed)
It was very nice to see you today!  Please come back next week to get blood work done.  Please continue to work on diet and exercise.  We will start [facility]  Please send me a message in a few weeks about how this is working and we can increase the dose.  Please try the triamcinolone for the rash and I will refer you to see an allergist.  We will see you back in a year for your next physical.  Please come back to see [place] sooner if needed.  Take care, Dr Jerline Pain  PLEASE NOTE:  If you had any lab tests please let us know if you have not heard back within a few days. You may see your results on mychart before we have a chance to review them but we will give you a call once they are reviewed by [place]. If we ordered any referrals today, please let us know if you have not heard from their office within the next week.   Please try these tips to maintain a healthy lifestyle:  Eat at least 3 REAL meals and 1-2 snacks per day.  Aim for no more than 5 hours between eating.  If you eat breakfast, please do so within one hour of getting up.   Each meal should contain half fruits/vegetables, one quarter protein, and one quarter carbs (no bigger than a computer mouse)  Cut down on sweet beverages. This includes juice, soda, and sweet tea.   Drink at least 1 glass of water with each meal and aim for at least 8 glasses per day  Exercise at least 150 minutes every week.    Preventive Care 61-77 Years Old, Female Preventive care refers to lifestyle choices and visits with your health care provider that can promote health and wellness. Preventive care visits are also called wellness exams. What can I expect for my preventive care visit? Counseling Your health care provider may ask you questions about your: Medical history, including: Past medical problems. Family medical history. Pregnancy history. Current health, including: Menstrual cycle. Method of birth control. Emotional well-being. Home life  and relationship well-being. Sexual activity and sexual health. Lifestyle, including: Alcohol, nicotine or tobacco, and drug use. Access to firearms. Diet, exercise, and sleep habits. Work and work Statistician. Sunscreen use. Safety issues such as seatbelt and bike helmet use. Physical exam Your health care provider will check your: Height and weight. These may be used to calculate your BMI (body mass index). BMI is a measurement that tells if you are at a healthy weight. Waist circumference. This measures the distance around your waistline. This measurement also tells if you are at a healthy weight and may help predict your risk of certain diseases, such as type 2 diabetes and high blood pressure. Heart rate and blood pressure. Body temperature. Skin for abnormal spots. What immunizations do I need?  Vaccines are usually given at various ages, according to a schedule. Your health care provider will recommend vaccines for you based on your age, medical history, and lifestyle or other factors, such as travel or where you work. What tests do I need? Screening Your health care provider may recommend screening tests for certain conditions. This may include: Lipid and cholesterol levels. Diabetes screening. This is done by checking your blood sugar (glucose) after you have not eaten for a while (fasting). Pelvic exam and Pap test. Hepatitis B test. Hepatitis C test. HIV (human immunodeficiency virus) test. STI (sexually transmitted infection) testing, if you  are at risk. Lung cancer screening. Colorectal cancer screening. Mammogram. Talk with your health care provider about when you should start having regular mammograms. This may depend on whether you have a family history of breast cancer. BRCA-related cancer screening. This may be done if you have a family history of breast, ovarian, tubal, or peritoneal cancers. Bone density scan. This is done to screen for osteoporosis. Talk with  your health care provider about your test results, treatment options, and if necessary, the need for more tests. Follow these instructions at home: Eating and drinking  Eat a diet that includes fresh fruits and vegetables, whole grains, lean protein, and low-fat dairy products. Take vitamin and mineral supplements as recommended by your health care provider. Do not drink alcohol if: Your health care provider tells you not to drink. You are pregnant, may be pregnant, or are planning to become pregnant. If you drink alcohol: Limit how much you have to 0-1 drink a day. Know how much alcohol is in your drink. In the [place] one drink equals one 12 oz bottle of beer (355 mL), one 5 oz glass of wine (148 mL), or one 1 oz glass of hard liquor (44 mL). Lifestyle Brush your teeth every morning and night with fluoride toothpaste. Floss one time each day. Exercise for at least 30 minutes 5 or more days each week. Do not use any products that contain nicotine or tobacco. These products include cigarettes, chewing tobacco, and vaping devices, such as e-cigarettes. If you need help quitting, ask your health care provider. Do not use drugs. If you are sexually active, practice safe sex. Use a condom or other form of protection to prevent STIs. If you do not wish to become pregnant, use a form of birth control. If you plan to become pregnant, see your health care provider for a prepregnancy visit. Take aspirin only as told by your health care provider. Make sure that you understand how much to take and what form to take. Work with your health care provider to find out whether it is safe and beneficial for you to take aspirin daily. Find healthy ways to manage stress, such as: Meditation, yoga, or listening to music. Journaling. Talking to a trusted person. Spending time with friends and family. Minimize exposure to UV radiation to reduce your risk of skin cancer. Safety Always wear your seat belt while  driving or riding in a vehicle. Do not drive: If you have been drinking alcohol. Do not ride with someone who has been drinking. When you are tired or distracted. While texting. If you have been using any mind-altering substances or drugs. Wear a helmet and other protective equipment during sports activities. If you have firearms in your house, make sure you follow all gun safety procedures. Seek help if you have been physically or sexually abused. What's next? Visit your health care provider once a year for an annual wellness visit. Ask your health care provider how often you should have your eyes and teeth checked. Stay up to date on all vaccines. This information is not intended to replace advice given to you by your health care provider. Make sure you discuss any questions you have with your health care provider. Document Revised: 11/25/2020 Document Reviewed: 11/25/2020 Elsevier Patient Education  [place].

## 2022-01-07 NOTE — Assessment & Plan Note (Signed)
Still has persistent symptoms despite Astelin, Flonase, Singulair, and Claritin.  We will place referral to allergist for further management.

## 2022-01-07 NOTE — Progress Notes (Signed)
Chief Complaint:  Cindy Pittman is a 46 y.o. female who presents today for her annual comprehensive physical exam.    Assessment/Plan:  Chronic Problems Addressed Today: Dyslipidemia Discussed lifestyle modifications.  We will check lipids.  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  Dermatitis Consistent with mild eczema flare.  We will start topical triamcinolone.  She can also continue using her daily emollients.  Overweight BMI 28 with comorbidities.  She has not had much success with diet and exercise.  We discussed medication options.  Recommended against phentermine or diethylpropion.  We will start Wegovy 0.25 mg weekly.  We discussed potential side effects.  She will follow-up in a few weeks and we can titrate up the dose as tolerated.  Allergic rhinitis Still has persistent symptoms despite Astelin, Flonase, Singulair, and Claritin.  We will place referral to allergist for further management.  Preventative Healthcare: Check labs.  Up-to-date on Pap.  Up-to-date on vaccines.  Patient Counseling(The following topics were reviewed and/or handout was given):  -Nutrition: Stressed importance of moderation in sodium/caffeine intake, saturated fat and cholesterol, caloric balance, sufficient intake of fresh fruits, vegetables, and fiber.  -Stressed the importance of regular exercise.   -Substance Abuse: Discussed cessation/primary prevention of tobacco, alcohol, or other drug use; driving or other dangerous activities under the influence; availability of treatment for abuse.   -Injury prevention: Discussed safety belts, safety helmets, smoke detector, smoking near bedding or upholstery.   -Sexuality: Discussed sexually transmitted diseases, partner selection, use of condoms, avoidance of unintended pregnancy and contraceptive alternatives.   -Dental health: Discussed importance of regular tooth brushing, flossing, and  dental visits.  -Health maintenance and immunizations reviewed. Please refer to Health maintenance section.  Return to care in 1 year for next preventative visit.     Subjective:  HPI:  She has no acute complaints today.  She has had intermittent with rash on her legs. This has been going on for the last several years. Most recently it started about 3-4 weeks. No obvious triggers or precipitating events   Lifestyle Diet: Balanced.  Try to cut down on sweets. Exercise: Plays tennis.      01/07/2022    8:09 AM  Depression screen PHQ 2/9  Decreased Interest 0  Down, Depressed, Hopeless 0  PHQ - 2 Score 0    There are no preventive care reminders to display for this patient.    ROS: =Per HPI, otherwise a complete review of systems was negative.   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   PCOS (polycystic ovarian syndrome) 07/18/2006   Past Surgical History:  Procedure Laterality Date   Labial Cystectomy     LUMBAR DISC SURGERY  1990   L4, Dr Gerlene Fee   Otic Tubes     SEPTOPLASTY     WISDOM TOOTH EXTRACTION      Family History  Problem Relation Age of Onset   Heart attack Mother 47       Smoker   Diabetes Mother  Breast cancer Mother    Diabetes Father    Gout Father    Heart attack Maternal Grandfather 22   Seizures Paternal Grandfather    Diabetes Cousin    Stroke Neg Hx     Medications- reviewed and updated Current Outpatient Medications  Medication Sig Dispense Refill   albuterol (VENTOLIN HFA) 108 (90 Base) MCG/ACT  inhaler Inhale 2 puffs into the lungs as directed. 1-2 puffs every 4-6 hours as needed 8.5 g 11   budesonide-formoterol (SYMBICORT) 80-4.5 MCG/ACT inhaler TAKE 2 PUFFS BY MOUTH TWICE A DAY 10.2 each 1   Cholecalciferol (VITAMIN D-3) 25 MCG (1000 UT) CAPS Take by mouth.     Diethylpropion HCl 25 MG TABS Take by mouth in the morning and at bedtime.     Levonorgestrel-Ethinyl Estradiol (AMETHIA) 0.1-0.02 & 0.01 MG tablet      Semaglutide-Weight Management (WEGOVY) 0.25 MG/0.5ML SOAJ Inject 0.25 mg into the skin once a week. 2 mL 0   triamcinolone cream (KENALOG) 0.5 % Apply 1 Application topically 3 (three) times daily. 30 g 3   azelastine (ASTELIN) 0.1 % nasal spray Place 2 sprays into both nostrils 2 (two) times daily. 30 mL 12   fluticasone (FLONASE) 50 MCG/ACT nasal spray Place 1 spray into both nostrils daily. 48 mL 1   montelukast (SINGULAIR) 10 MG tablet TAKE 1 TABLET BY MOUTH EVERYDAY AT BEDTIME 90 tablet 3   No current facility-administered medications for this visit.    Allergies-reviewed and updated Allergies  Allergen Reactions   Sulfonamide Derivatives     Rash as child    Social History   Socioeconomic History   Marital status: Single    Spouse name: Not on file   Number of children: Not on file   Years of education: Not on file   Highest education level: Not on file  Occupational History   Not on file  Tobacco Use   Smoking status: Never   Smokeless tobacco: Never  Substance and Sexual Activity   Alcohol use: Yes    Comment: Rarely   Drug use: No   Sexual activity: Not on file  Other Topics Concern   Not on file  Social History Narrative   Not on file   Social Determinants of Health   Financial Resource Strain: Not on file  Food Insecurity: Not on file  Transportation Needs: Not on file  Physical Activity: Not on file  Stress: Not on file  Social Connections: Not on file        Objective:  Physical Exam: BP 119/75   Pulse (!) 48   Temp 98 F  (36.7 C) (Temporal)   Ht 5' 6.5" (1.689 m)   Wt 178 lb 9.6 oz (81 kg)   SpO2 97%   BMI 28.39 kg/m   Body mass index is 28.39 kg/m. Wt Readings from Last 3 Encounters:  01/07/22 178 lb 9.6 oz (81 kg)  05/11/21 175 lb 6 oz (79.5 kg)  05/03/21 175 lb 6.1 oz (79.6 kg)   Gen: NAD, resting comfortably HEENT: TMs normal bilaterally. OP clear. No thyromegaly noted.  CV: RRR with no murmurs appreciated Pulm: NWOB, CTAB with no crackles, wheezes, or rhonchi GI: Normal bowel sounds present. Soft, Nontender, Nondistended. MSK: no edema, cyanosis, or clubbing noted Skin: warm, dry.  Eczematous appearing rash on left lower extremity. Neuro: CN2-12 grossly intact. Strength 5/5 in upper and lower extremities. Reflexes symmetric and intact bilaterally.  Psych: Normal affect and thought content  Katina Degree. Jimmey Ralph, MD 01/07/2022 8:43 AM

## 2022-01-07 NOTE — Assessment & Plan Note (Signed)
BMI 28 with comorbidities.  She has not had much success with diet and exercise.  We discussed medication options.  Recommended against phentermine or diethylpropion.  We will start Wegovy 0.25 mg weekly.  We discussed potential side effects.  She will follow-up in a few weeks and we can titrate up the dose as tolerated.

## 2022-01-07 NOTE — Assessment & Plan Note (Signed)
Stable on current regimen of Singulair, albuterol, and Symbicort.

## 2022-01-07 NOTE — Assessment & Plan Note (Addendum)
Discussed lifestyle modifications.  We will check lipids. 

## 2022-01-07 NOTE — Assessment & Plan Note (Signed)
Consistent with mild eczema flare.  We will start topical triamcinolone.  She can also continue using her daily emollients.

## 2022-01-14 ENCOUNTER — Other Ambulatory Visit (INDEPENDENT_AMBULATORY_CARE_PROVIDER_SITE_OTHER): Payer: BC Managed Care – PPO

## 2022-01-14 ENCOUNTER — Telehealth: Payer: Self-pay | Admitting: *Deleted

## 2022-01-14 DIAGNOSIS — Z0001 Encounter for general adult medical examination with abnormal findings: Secondary | ICD-10-CM

## 2022-01-14 DIAGNOSIS — Z114 Encounter for screening for human immunodeficiency virus [HIV]: Secondary | ICD-10-CM

## 2022-01-14 DIAGNOSIS — R5383 Other fatigue: Secondary | ICD-10-CM

## 2022-01-14 DIAGNOSIS — R739 Hyperglycemia, unspecified: Secondary | ICD-10-CM

## 2022-01-14 DIAGNOSIS — E785 Hyperlipidemia, unspecified: Secondary | ICD-10-CM

## 2022-01-14 DIAGNOSIS — Z1159 Encounter for screening for other viral diseases: Secondary | ICD-10-CM

## 2022-01-14 LAB — COMPREHENSIVE METABOLIC PANEL
ALT: 19 U/L (ref 0–35)
AST: 16 U/L (ref 0–37)
Albumin: 4.5 g/dL (ref 3.5–5.2)
Alkaline Phosphatase: 101 U/L (ref 39–117)
BUN: 9 mg/dL (ref 6–23)
CO2: 26 mEq/L (ref 19–32)
Calcium: 9.4 mg/dL (ref 8.4–10.5)
Chloride: 104 mEq/L (ref 96–112)
Creatinine, Ser: 0.86 mg/dL (ref 0.40–1.20)
GFR: 80.83 mL/min (ref >60.00)
Glucose, Bld: 83 mg/dL (ref 70–99)
Potassium: 4.3 mEq/L (ref 3.5–5.1)
Sodium: 138 mEq/L (ref 135–145)
Total Bilirubin: 1 mg/dL (ref 0.2–1.2)
Total Protein: 7 g/dL (ref 6.0–8.3)

## 2022-01-14 LAB — HEMOGLOBIN A1C: Hgb A1c MFr Bld: 5.4 % (ref 4.6–6.5)

## 2022-01-14 LAB — CBC
HCT: 40.7 % (ref 36.0–46.0)
Hemoglobin: 13.9 g/dL (ref 12.0–15.0)
MCHC: 34.2 g/dL (ref 30.0–36.0)
MCV: 90.1 fl (ref 78.0–100.0)
Platelets: 298 10*3/uL (ref 150.0–400.0)
RBC: 4.51 Mil/uL (ref 3.87–5.11)
RDW: 12.8 % (ref 11.5–15.5)
WBC: 7 10*3/uL (ref 4.0–10.5)

## 2022-01-14 LAB — LIPID PANEL
Cholesterol: 178 mg/dL (ref 0–200)
HDL: 43.2 mg/dL (ref >39.00)
LDL Cholesterol: 115 mg/dL — ABNORMAL HIGH (ref 0–99)
NonHDL: 134.48
Total CHOL/HDL Ratio: 4
Triglycerides: 96 mg/dL (ref 0.0–149.0)
VLDL: 19.2 mg/dL (ref 0.0–40.0)

## 2022-01-14 LAB — VITAMIN D 25 HYDROXY (VIT D DEFICIENCY, FRACTURES): VITD: 22.04 ng/mL — ABNORMAL LOW (ref 30.00–100.00)

## 2022-01-14 LAB — TSH: TSH: 2.21 u[IU]/mL (ref 0.35–5.50)

## 2022-01-14 LAB — VITAMIN B12: Vitamin B-12: 393 pg/mL (ref 211–911)

## 2022-01-14 NOTE — Telephone Encounter (Signed)
(  Key: Fernande Bras) Rx #: [id number] VCBSWH 0.25MG /0.5ML auto-injector Waiting for determination

## 2022-01-15 NOTE — Telephone Encounter (Signed)
Your PA request has been denied 

## 2022-01-16 LAB — HEPATITIS C ANTIBODY: Hepatitis C Ab: NONREACTIVE

## 2022-01-16 LAB — HIV ANTIBODY (ROUTINE TESTING W REFLEX): HIV 1&2 Ab, 4th Generation: NONREACTIVE

## 2022-01-17 ENCOUNTER — Telehealth: Payer: Self-pay | Admitting: Family Medicine

## 2022-01-17 NOTE — Telephone Encounter (Signed)
FYI- Patient returned call to go over lab results. Pt requests a callback at [phone].

## 2022-01-17 NOTE — Progress Notes (Signed)
Please inform patient of the following:  Vitamin D is low low but everything else is normal.  Recommend she take 1000 to 2000 IUs daily.  We can recheck again in 6 to 12 months.  We can recheck everything else next year.

## 2022-01-17 NOTE — Telephone Encounter (Signed)
PA  formulary expedition faxed to [phone]

## 2022-01-20 NOTE — Telephone Encounter (Signed)
See result notes. 

## 2022-01-26 ENCOUNTER — Other Ambulatory Visit: Payer: Self-pay | Admitting: Obstetrics and Gynecology

## 2022-01-26 DIAGNOSIS — Z803 Family history of malignant neoplasm of breast: Secondary | ICD-10-CM

## 2022-02-14 ENCOUNTER — Ambulatory Visit
Admission: RE | Admit: 2022-02-14 | Discharge: 2022-02-14 | Disposition: A | Discharge status: Home / Self Care | Payer: BC Managed Care – PPO | Source: Ambulatory Visit | Attending: Emergency Medicine | Admitting: Emergency Medicine

## 2022-02-14 VITALS — BP 143/85 | HR 52 | Temp 99.0°F | Resp 16

## 2022-02-14 DIAGNOSIS — H6982 Other specified disorders of Eustachian tube, left ear: Secondary | ICD-10-CM | POA: Diagnosis not present

## 2022-02-14 DIAGNOSIS — R6884 Jaw pain: Secondary | ICD-10-CM | POA: Diagnosis not present

## 2022-02-14 MED ORDER — METHYLPREDNISOLONE 4 MG PO TBPK: ORAL_TABLET | ORAL | 0 refills | Status: DC

## 2022-02-14 MED ORDER — BACLOFEN 10 MG PO TABS: 10.0000 mg | ORAL_TABLET | Freq: Every day | ORAL | 0 refills | Status: AC

## 2022-02-14 NOTE — ED Provider Notes (Signed)
[facility]    CSN: [id number] Arrival date & time: 02/14/22  1846    HISTORY   Chief Complaint  Patient presents with   Ear Fullness    My left ear has been bothering me for a week or so. Most of the time I find myself pulling and tugging at it due to the fluid and pressure. Sometimes there is pain. My left jaw starting hurting a week and half ago as well. Not sure if related or not. - Entered by patient   HPI Cindy Pittman is a pleasant, 46 y.o. female who presents to urgent care today. Patient complains of her left ear bothering her for the past week or so.  Patient states she has been catching herself pulling and tugging on her left ear because she feels fluid and pressure inside her ear.  Patient states sometimes she has pain in her ear.  Patient states about a week ago her left jaw started to hurt as well.  Patient states she is not sure if they are related or not.  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  The history is provided by the patient.   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    Home Medications    Prior to Admission medications   Medication Sig Start Date  End Date Taking? Authorizing Provider  albuterol (VENTOLIN HFA) 108 (90 Base) MCG/ACT inhaler Inhale 2 puffs into the lungs as directed. 1-2 puffs every 4-6 hours as needed 12/29/20   Ardith Dark, MD  azelastine (ASTELIN) 0.1 % nasal spray Place 2 sprays into both nostrils 2 (two) times daily. 01/07/22   Ardith Dark, MD  budesonide-formoterol [facility]) 80-4.5 MCG/ACT inhaler TAKE 2 PUFFS BY MOUTH TWICE A DAY 10/15/21   Theadora Rama Scales, PA-C  Cholecalciferol (VITAMIN D-3) 25 MCG (1000 UT) CAPS Take by mouth.    [provider]  Diethylpropion HCl 25 MG TABS Take by mouth in the morning and at bedtime.    [provider]  fluticasone (FLONASE) 50 MCG/ACT nasal spray Place 1 spray into both nostrils daily. 01/07/22   Ardith Dark, MD  Levonorgestrel-Ethinyl Estradiol (AMETHIA) 0.1-0.02 & 0.01 MG tablet  08/25/18   [provider]  montelukast (SINGULAIR) 10 MG tablet TAKE 1 TABLET BY MOUTH EVERYDAY AT BEDTIME 01/07/22   Ardith Dark, MD  Semaglutide-Weight Management (WEGOVY) 0.25 MG/0.5ML SOAJ Inject 0.25 mg into the skin once a week. 01/07/22   Ardith Dark, MD  triamcinolone cream (KENALOG) 0.5 % Apply 1 Application topically 3 (three) times  daily. 01/07/22   Ardith Dark, MD    Family History Family History  Problem Relation Age of Onset   Heart attack Mother 2       Smoker   Diabetes Mother    Breast cancer Mother    Diabetes Father    Gout Father    Heart attack Maternal Grandfather 55   Seizures Paternal Grandfather    Diabetes Cousin    Stroke Neg Hx    Social History Social History   Tobacco Use   Smoking status: Never   Smokeless tobacco: Never  Substance Use Topics   Alcohol use: Yes    Comment: Rarely   Drug use: No   Allergies   Sulfonamide derivatives  Review of Systems Review of Systems Pertinent findings revealed after performing a 14 point review of systems has been noted in the history of present  illness.  Physical Exam Triage Vital Signs ED Triage Vitals  Enc Vitals Group     BP 04/09/21 0827 (!) 147/82     Pulse Rate 04/09/21 0827 72     Resp 04/09/21 0827 18     Temp 04/09/21 0827 98.3 F (36.8 C)     Temp Source 04/09/21 0827 Oral     SpO2 04/09/21 0827 98 %     Weight --      Height --      Head Circumference --      Peak Flow --      Pain Score 04/09/21 0826 5     Pain Loc --      Pain Edu? --      Excl. in GC? --   No data found.  Updated Vital Signs BP (!) 143/85 (BP Location: Left Arm)   Pulse (!) 52   Temp 99 F (37.2 C) (Oral)   Resp 16   SpO2 99%   Physical Exam Vitals and nursing note reviewed.  Constitutional:      General: She is not in acute distress.    Appearance: Normal appearance. She is not ill-appearing.  HENT:     Head: Normocephalic and atraumatic.     Salivary Glands: Right salivary gland is not diffusely enlarged or tender. Left salivary gland is not diffusely enlarged or tender.     Right Ear: Tympanic membrane, ear canal and external ear normal. No drainage. No middle ear effusion. There is no impacted cerumen. Tympanic membrane is not erythematous or bulging.     Left Ear: Tympanic membrane, ear canal and external ear normal. No drainage.  No middle ear effusion. There is no impacted cerumen. Tympanic membrane is not erythematous or bulging.     Nose: Nose normal. No nasal deformity, septal deviation, mucosal edema, congestion or rhinorrhea.     Right Turbinates: Not enlarged, swollen or pale.     Left Turbinates: Not enlarged, swollen or pale.     Right Sinus: No maxillary sinus tenderness or frontal sinus tenderness.     Left Sinus: No maxillary sinus tenderness or frontal sinus tenderness.     Mouth/Throat:     Lips: Pink. No lesions.     Mouth: Mucous membranes are moist. No oral lesions.     Pharynx: Oropharynx is clear. Uvula midline. No posterior oropharyngeal erythema or uvula swelling.     Tonsils: No tonsillar exudate. 0  on the right. 0 on the left.  Eyes:     General: Lids are normal.        Right eye: No discharge.  Left eye: No discharge.     Extraocular Movements: Extraocular movements intact.     Conjunctiva/sclera: Conjunctivae normal.     Right eye: Right conjunctiva is not injected.     Left eye: Left conjunctiva is not injected.  Neck:     Trachea: Trachea and phonation normal.  Cardiovascular:     Rate and Rhythm: Normal rate and regular rhythm.     Pulses: Normal pulses.     Heart sounds: Normal heart sounds. No murmur heard.    No friction rub. No gallop.  Pulmonary:     Effort: Pulmonary effort is normal. No accessory muscle usage, prolonged expiration or respiratory distress.     Breath sounds: Normal breath sounds. No stridor, decreased air movement or transmitted upper airway sounds. No decreased breath sounds, wheezing, rhonchi or rales.  Chest:     Chest wall: No tenderness.  Musculoskeletal:        General: Normal range of motion.     Cervical back: Normal range of motion and neck supple. Normal range of motion.  Lymphadenopathy:     Cervical: No cervical adenopathy.  Skin:    General: Skin is warm and dry.     Findings: No erythema or rash.  Neurological:     General: No focal deficit present.     Mental Status: She is alert and oriented to person, place, and time.  Psychiatric:        Mood and Affect: Mood normal.        Behavior: Behavior normal.     Visual Acuity Right Eye Distance:   Left Eye Distance:   Bilateral Distance:    Right Eye Near:   Left Eye Near:    Bilateral Near:     UC Couse / Diagnostics / Procedures:     Radiology No results found.  Procedures Procedures (including critical care time) EKG  Pending results:  Labs Reviewed - No data to display  Medications Ordered in UC: Medications - No data to display  UC Diagnoses / Final Clinical Impressions(s)   I have reviewed the triage vital signs and the nursing notes.  Pertinent  labs & imaging results that were available during my care of the patient were reviewed by me and considered in my medical decision making (see chart for details).    Final diagnoses:  Eustachian tube dysfunction, left  Jaw pain   Physical exam today is entirely unremarkable.  Patient advised that TMJ dysfunction is a concern as is eustachian tube dysfunction.  Patient states she has a appointment with a dentist next week.  Patient provided with a muscle relaxer and steroid to calm inflammation of either her eustachian tube or her TMJ.  ED Prescriptions     Medication Sig Dispense Auth. Provider   baclofen (LIORESAL) 10 MG tablet Take 1 tablet (10 mg total) by mouth at bedtime for 7 days. 7 tablet Theadora Rama Scales, PA-C   methylPREDNISolone (MEDROL DOSEPAK) 4 MG TBPK tablet Take 24 mg on day 1, 20 mg on day 2, 16 mg on day 3, 12 mg on day 4, 8 mg on day 5, 4 mg on day 6.  Take all tablets in each row at once, do not spread tablets out throughout the day. 21 tablet Theadora Rama Scales, PA-C      PDMP not reviewed this encounter.  Pending results:  Labs Reviewed - No data to display  Discharge Instructions:   Discharge Instructions      I sent prescription  for methylprednisolone to your pharmacy that you should take once daily with your breakfast meal.  If this is helpful, please complete the full 6 days.    If this is not helpful after 3 days, you are welcome to take baclofen at bedtime to see if a muscle relaxer is effective at relieving your jaw tension which is likely causing your jaw pain.  Because it is a little bit late to take baclofen given your early morning hours and I do not recommend taking methylprednisolone at night, I recommend that you take ibuprofen 800 mg before bedtime tonight for temporary relief.  Consider following up with your ear nose and throat provider and also discussed this issue with your dentist to see if they have any other suggestions.  The ENT  provider that I mentioned is [facility] in [place].      Disposition Upon Discharge:  Condition: stable for discharge home  Patient presented with an acute illness with associated systemic symptoms and significant discomfort requiring urgent management. In my opinion, this is a condition that a prudent lay person (someone who possesses an average knowledge of health and medicine) may potentially expect to result in complications if not addressed urgently such as respiratory distress, impairment of bodily function or dysfunction of bodily organs.   Routine symptom specific, illness specific and/or disease specific instructions were discussed with the patient and/or caregiver at length.   As such, the patient has been evaluated and assessed, work-up was performed and treatment was provided in alignment with urgent care protocols and evidence based medicine.  Patient/parent/caregiver has been advised that the patient may require follow up for further testing and treatment if the symptoms continue in spite of treatment, as clinically indicated and appropriate.  Patient/parent/caregiver has been advised to return to the [facility] or PCP if no better; to PCP or the Emergency Department if new signs and symptoms develop, or if the current signs or symptoms continue to change or worsen for further workup, evaluation and treatment as clinically indicated and appropriate  The patient will follow up with their current PCP if and as advised. If the patient does not currently have a PCP we will assist them in obtaining one.   The patient may need specialty follow up if the symptoms continue, in spite of conservative treatment and management, for further workup, evaluation, consultation and treatment as clinically indicated and appropriate.   Patient/parent/caregiver verbalized understanding and agreement of plan as discussed.  All questions were addressed during visit.  Please see discharge instructions below  for further details of plan.  This office note has been dictated using Teaching laboratory technician.  Unfortunately, this method of dictation can sometimes lead to typographical or grammatical errors.  I apologize for your inconvenience in advance if this occurs.  Please do not hesitate to reach out to me if clarification is needed.      Theadora Rama Scales, PA-C 02/15/22 [phone]

## 2022-02-14 NOTE — ED Triage Notes (Signed)
The pt states she has had left ear fullness and discomfort for about a week. The patient states she was told she has fluid in her left war back in June and she states she is now having left jaw pain.

## 2022-02-14 NOTE — Discharge Instructions (Addendum)
I sent prescription for methylprednisolone to your pharmacy that you should take once daily with your breakfast meal.  If this is helpful, please complete the full 6 days.    If this is not helpful after 3 days, you are welcome to take baclofen at bedtime to see if a muscle relaxer is effective at relieving your jaw tension which is likely causing your jaw pain.  Because it is a little bit late to take baclofen given your early morning hours and I do not recommend taking methylprednisolone at night, I recommend that you take ibuprofen 800 mg before bedtime tonight for temporary relief.  Consider following up with your ear nose and throat provider and also discussed this issue with your dentist to see if they have any other suggestions.  The ENT provider that I mentioned is [facility] in [place].

## 2022-03-07 ENCOUNTER — Encounter: Payer: Self-pay | Admitting: *Deleted

## 2022-03-23 ENCOUNTER — Encounter: Payer: Self-pay | Admitting: Family Medicine

## 2022-04-09 ENCOUNTER — Ambulatory Visit
Admission: RE | Admit: 2022-04-09 | Discharge: 2022-04-09 | Disposition: A | Discharge status: Home / Self Care | Payer: BC Managed Care – PPO | Source: Ambulatory Visit | Attending: Obstetrics and Gynecology | Admitting: Obstetrics and Gynecology

## 2022-04-09 DIAGNOSIS — Z803 Family history of malignant neoplasm of breast: Secondary | ICD-10-CM

## 2022-04-09 MED ORDER — GADOPICLENOL 0.5 MMOL/ML IV SOLN
7.5000 mL | Freq: Once | INTRAVENOUS | Status: AC | PRN
  Administered 2022-04-09: 7.5 mL via INTRAVENOUS

## 2022-04-11 ENCOUNTER — Other Ambulatory Visit: Payer: Self-pay | Admitting: Obstetrics and Gynecology

## 2022-04-11 DIAGNOSIS — R9389 Abnormal findings on diagnostic imaging of other specified body structures: Secondary | ICD-10-CM

## 2022-04-21 ENCOUNTER — Ambulatory Visit
Admission: RE | Admit: 2022-04-21 | Discharge: 2022-04-21 | Disposition: A | Discharge status: Home / Self Care | Payer: BC Managed Care – PPO | Source: Ambulatory Visit | Attending: Obstetrics and Gynecology | Admitting: Obstetrics and Gynecology

## 2022-04-21 ENCOUNTER — Other Ambulatory Visit: Payer: Self-pay | Admitting: Obstetrics and Gynecology

## 2022-04-21 DIAGNOSIS — R9389 Abnormal findings on diagnostic imaging of other specified body structures: Secondary | ICD-10-CM

## 2022-04-21 MED ORDER — GADOPICLENOL 0.5 MMOL/ML IV SOLN
7.0000 mL | Freq: Once | INTRAVENOUS | Status: AC | PRN
  Administered 2022-04-21: 7 mL via INTRAVENOUS

## 2022-04-29 ENCOUNTER — Encounter: Payer: Self-pay | Admitting: Family Medicine

## 2022-04-29 ENCOUNTER — Ambulatory Visit (INDEPENDENT_AMBULATORY_CARE_PROVIDER_SITE_OTHER): Payer: BC Managed Care – PPO | Admitting: Family Medicine

## 2022-04-29 VITALS — BP 137/85 | HR 56 | Temp 98.2°F | Ht 66.5 in | Wt 179.2 lb

## 2022-04-29 DIAGNOSIS — Z111 Encounter for screening for respiratory tuberculosis: Secondary | ICD-10-CM

## 2022-04-29 DIAGNOSIS — L309 Dermatitis, unspecified: Secondary | ICD-10-CM

## 2022-04-29 DIAGNOSIS — J45990 Exercise induced bronchospasm: Secondary | ICD-10-CM

## 2022-04-29 DIAGNOSIS — E785 Hyperlipidemia, unspecified: Secondary | ICD-10-CM | POA: Diagnosis not present

## 2022-04-29 NOTE — Progress Notes (Signed)
   Cindy Pittman is a 46 y.o. female who presents today for an office visit.  Assessment/Plan:  Chronic Problems Addressed Today: Dyslipidemia Mildly elevated LDL last time we checked.  Continue lifestyle modifications recheck next physical.  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  Dermatitis Stable on triamcinolone as needed.    Subjective:  HPI:  See A/p for status of chronic conditions.    She is here today for form completion.  We will be starting a new job in January.  Needs health certificate screening completed.       Objective:  Physical Exam: BP 137/85   Pulse (!) 56   Temp 98.2 F (36.8 C) (Temporal)   Ht 5' 6.5" (1.689 m)   Wt 179 lb 3.2 oz (81.3 kg)   LMP  (LMP Unknown)   SpO2 98%   BMI 28.49 kg/m   Gen: No acute distress, resting comfortably CV: Regular rate and rhythm with no murmurs appreciated Pulm: Normal work of breathing, clear to auscultation bilaterally with no crackles, wheezes, or rhonchi Neuro: Grossly normal, moves all extremities Psych: Normal affect and thought content      Cindy Pittman M. Jimmey Ralph, MD 04/29/2022 8:12 AM

## 2022-04-29 NOTE — Assessment & Plan Note (Signed)
Stable on triamcinolone as needed. ?

## 2022-04-29 NOTE — Patient Instructions (Signed)
It was very nice to see you today!  We will check for tuberculosis today with a blood test.  We will complete your form once the test are back.  We will see back next year for your physical.  Come back sooner if needed.  Take care, Dr Jimmey Ralph  PLEASE NOTE:  If you had any lab tests please let us know if you have not heard back within a few days. You may see your results on mychart before we have a chance to review them but we will give you a call once they are reviewed by [place]. If we ordered any referrals today, please let us know if you have not heard from their office within the next week.   Please try these tips to maintain a healthy lifestyle:  Eat at least 3 REAL meals and 1-2 snacks per day.  Aim for no more than 5 hours between eating.  If you eat breakfast, please do so within one hour of getting up.   Each meal should contain half fruits/vegetables, one quarter protein, and one quarter carbs (no bigger than a computer mouse)  Cut down on sweet beverages. This includes juice, soda, and sweet tea.   Drink at least 1 glass of water with each meal and aim for at least 8 glasses per day  Exercise at least 150 minutes every week.

## 2022-04-29 NOTE — Assessment & Plan Note (Signed)
Stable on current regimen Singulair, albuterol, and Symbicort.

## 2022-04-29 NOTE — Assessment & Plan Note (Signed)
Mildly elevated LDL last time we checked.  Continue lifestyle modifications recheck next physical.

## 2022-05-06 LAB — QUANTIFERON-TB GOLD PLUS
Mitogen-NIL: >10 IU/mL
NIL: 0.06 IU/mL
QuantiFERON-TB Gold Plus: NEGATIVE
TB1-NIL: 0 IU/mL
TB2-NIL: <0 IU/mL

## 2022-05-07 IMAGING — US US THYROID
1 series · 13 of 25 positions shown · non-contrast
Comparison: None.

CLINICAL DATA: Palpable abnormality. Goiter palpated on physical
examination.

EXAM:
THYROID ULTRASOUND
TECHNIQUE: Ultrasound examination of the thyroid gland and adjacent soft
tissues was performed.

[Series 1: us thyroid · 0.03mm/px · 13 of 51 slices shown]
[im 1/51]
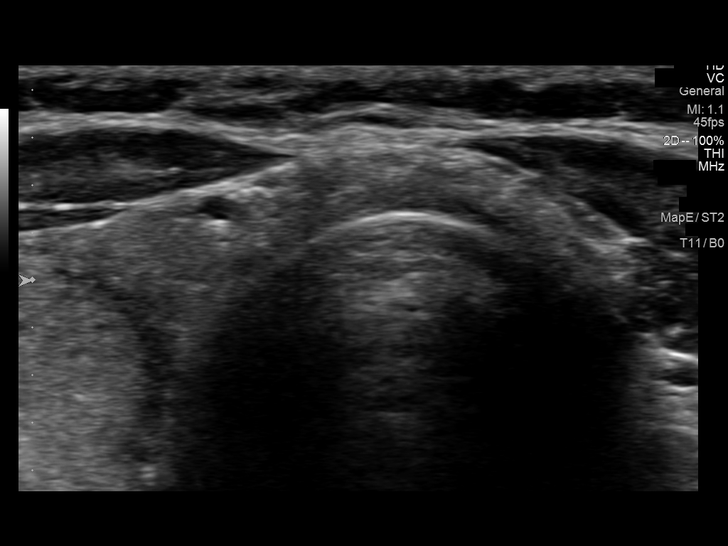
[im 5/51]
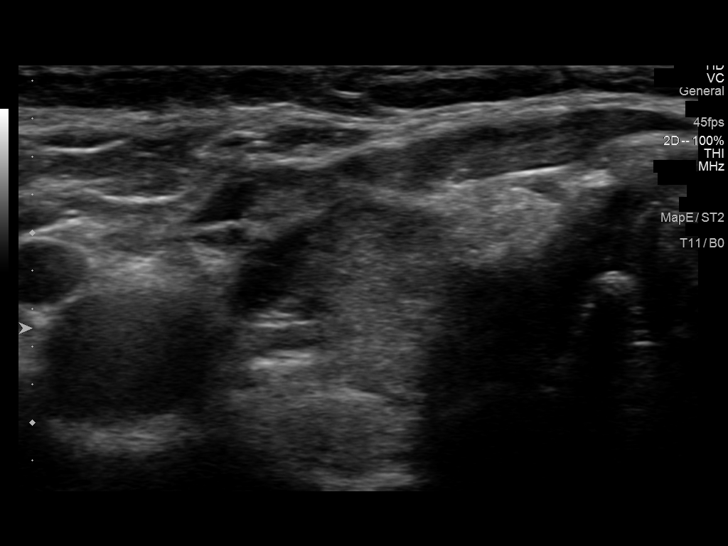
[im 9/51]
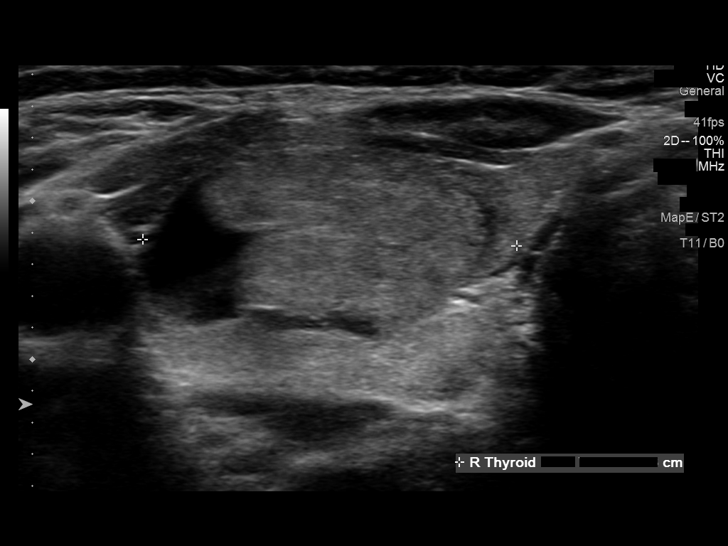
[im 13/51]
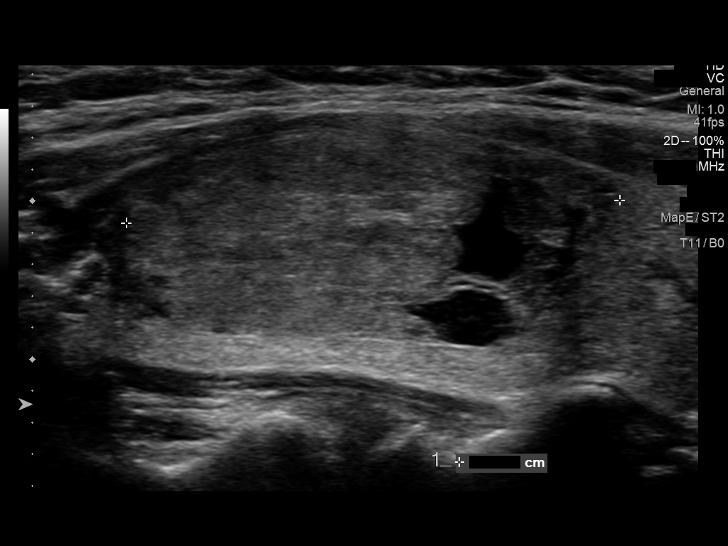
[im 17/51]
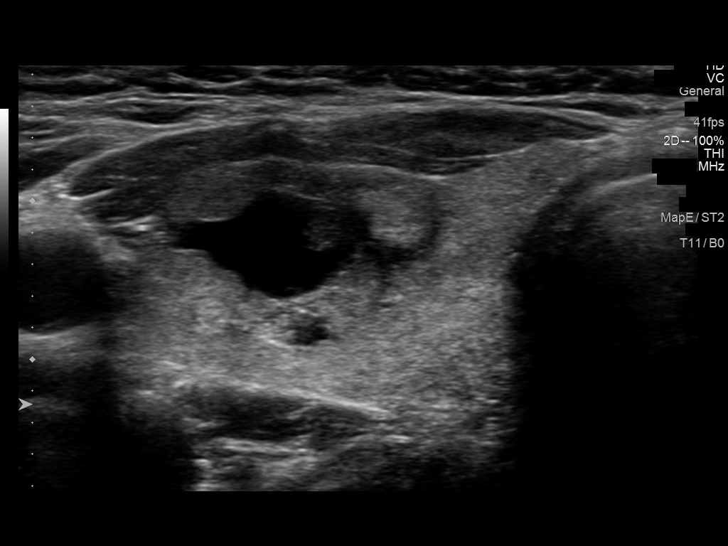
[im 21/51]
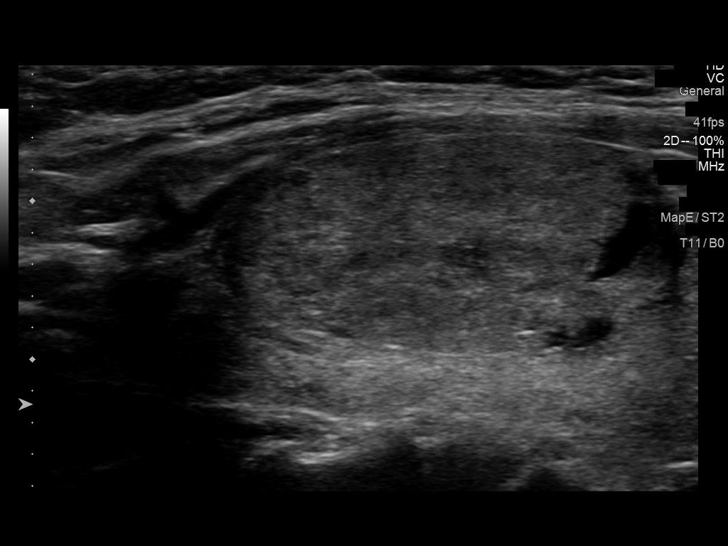
[im 26/51]
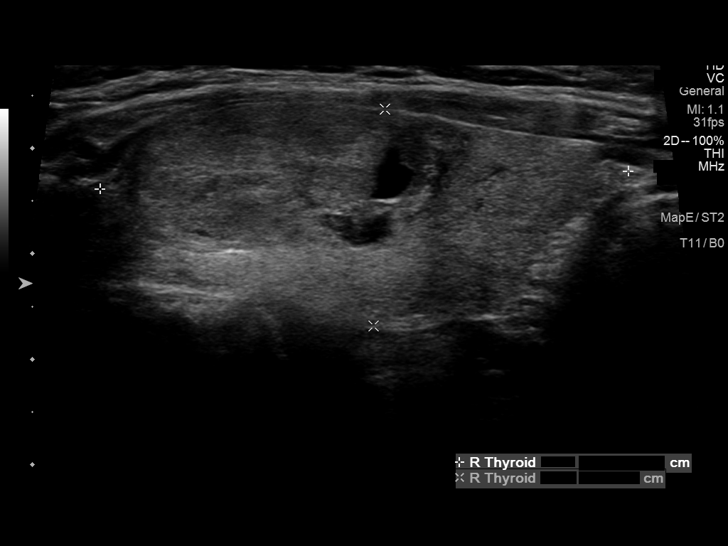
[im 30/51]
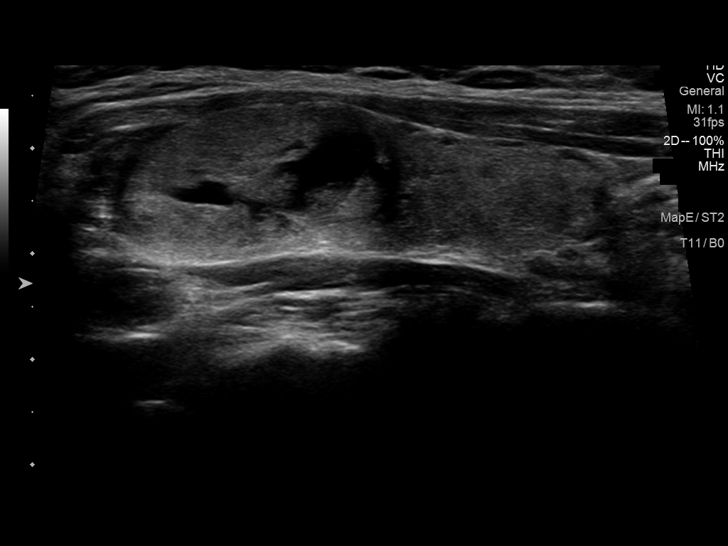
[im 34/51]
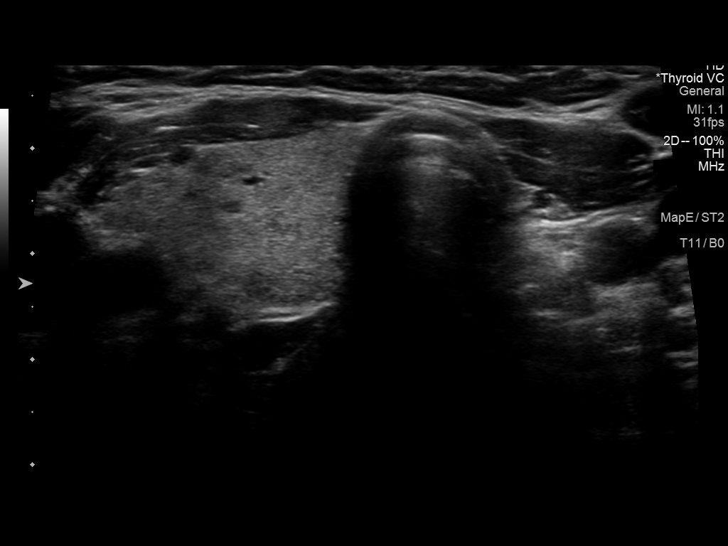
[im 38/51]
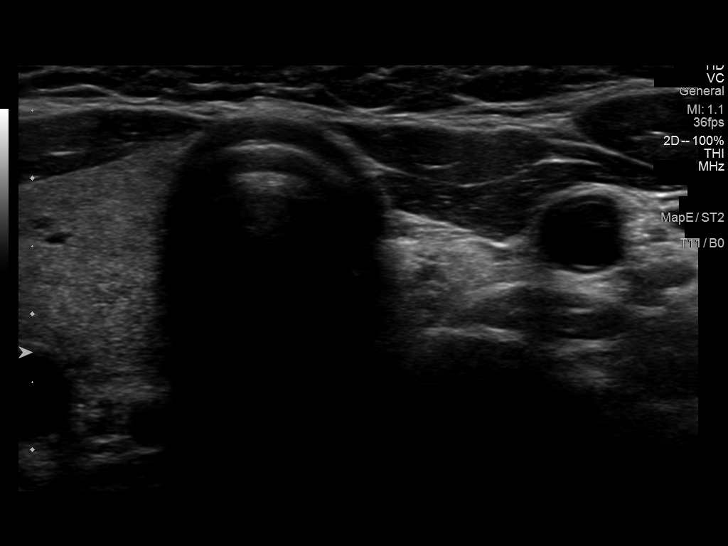
[im 42/51]
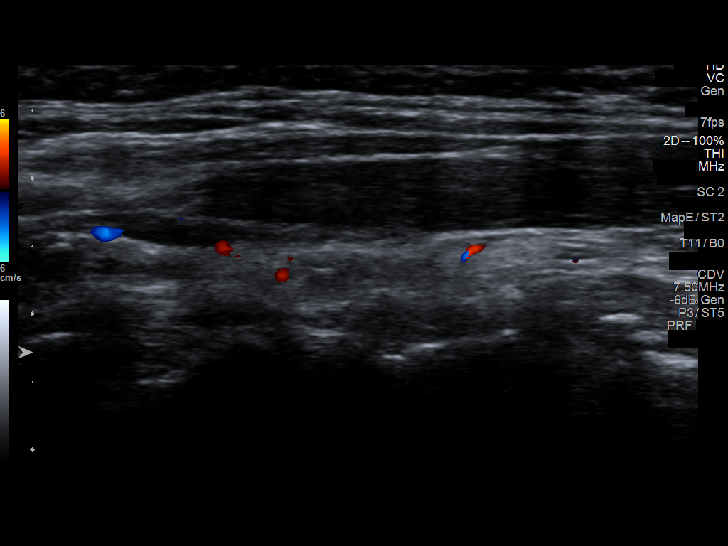
[im 46/51]
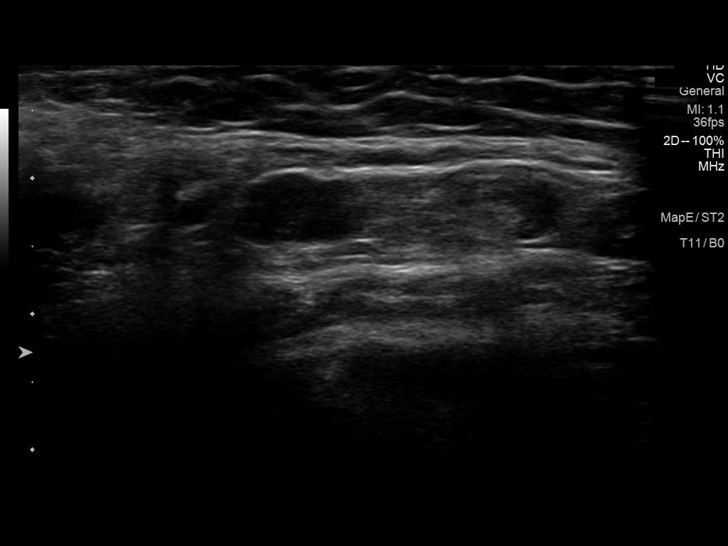
[im 51/51]
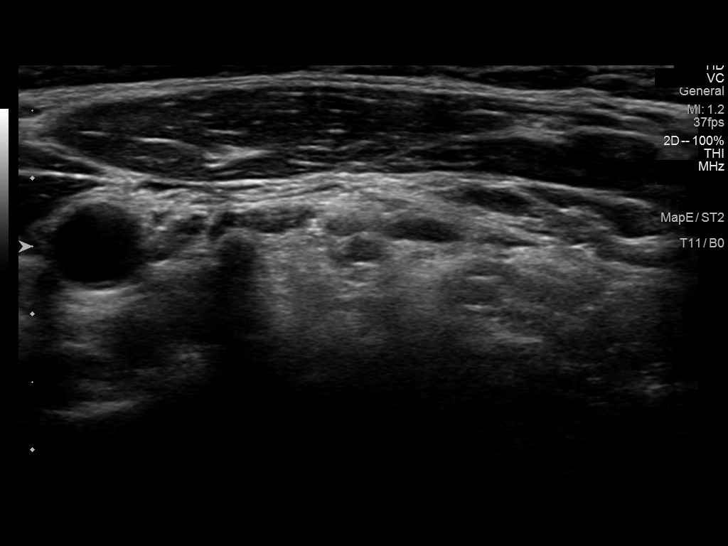

[13 of 25 positions shown; findings below may reference images not displayed]

FINDINGS: Parenchymal Echotexture: Moderately heterogenous

Isthmus: Normal in size measures 0.2 cm in diameter

Right lobe: Normal in size measuring 5.0 x 2.1 x 2.4 cm

Left lobe: Atrophic measuring 2.4 x 0.5 x 0.7 cm

_________________________________________________________

Estimated total number of nodules >/= 1 cm: 1

Number of spongiform nodules >/=  2 cm not described below (TR1): 0

Number of mixed cystic and solid nodules >/= 1.5 cm not described
below (TR2): 0

_________________________________________________________

Nodule # 1:

Location: Right; Mid

Maximum size: 3.1 cm; Other 2 dimensions: 2.2 x 1.6 cm

Composition: solid/almost completely solid (2)

Echogenicity: isoechoic (1)

Shape: not taller-than-wide (0)

Margins: ill-defined (0)

Echogenic foci: none (0)

ACR TI-RADS total points: 3.

ACR TI-RADS risk category: TR3 (3 points).

ACR TI-RADS recommendations:

**Given size (>/= 2.5 cm) and appearance, fine needle aspiration of
this mildly suspicious nodule should be considered based on TI-RADS
criteria.

_________________________________________________________
IMPRESSION: 1. Atrophic and moderately heterogeneous appearing thyroid gland,
nonspecific though could be seen in the setting of a chronic
thyroiditis. Clinical correlation is advised.
2. Solitary right-sided thyroid nodule, potentially correlating with
the palpable area of concern, meets imaging criteria to recommend
percutaneous sampling as clinically indicated.

The above is in keeping with the ACR TI-RADS recommendations - [HOSPITAL] 5461;[DATE].

## 2022-05-09 NOTE — Progress Notes (Signed)
Please inform patient of the following:  Her TB test is negative. Ok for [place] to complete form for her work.  Katina Degree. Jimmey Ralph, MD 05/09/2022 12:53 PM

## 2022-05-28 ENCOUNTER — Other Ambulatory Visit: Payer: Self-pay | Admitting: Family Medicine

## 2022-05-28 DIAGNOSIS — J45990 Exercise induced bronchospasm: Secondary | ICD-10-CM

## 2022-06-14 ENCOUNTER — Telehealth: Payer: BC Managed Care – PPO | Admitting: Physician Assistant

## 2022-06-14 DIAGNOSIS — B9689 Other specified bacterial agents as the cause of diseases classified elsewhere: Secondary | ICD-10-CM | POA: Diagnosis not present

## 2022-06-14 DIAGNOSIS — J019 Acute sinusitis, unspecified: Secondary | ICD-10-CM | POA: Diagnosis not present

## 2022-06-14 MED ORDER — AMOXICILLIN-POT CLAVULANATE 875-125 MG PO TABS: 1.0000 | ORAL_TABLET | Freq: Two times a day (BID) | ORAL | 0 refills | Status: DC

## 2022-06-14 NOTE — Progress Notes (Signed)
I have spent 5 minutes in review of e-visit questionnaire, review and updating patient chart, medical decision making and response to patient.   Zayli Villafuerte Cody Auren Valdes, PA-C    

## 2022-06-14 NOTE — Progress Notes (Signed)

## 2022-08-26 ENCOUNTER — Other Ambulatory Visit: Payer: Self-pay | Admitting: Obstetrics and Gynecology

## 2022-08-26 DIAGNOSIS — Z803 Family history of malignant neoplasm of breast: Secondary | ICD-10-CM

## 2022-09-18 ENCOUNTER — Ambulatory Visit
Admission: RE | Admit: 2022-09-18 | Discharge: 2022-09-18 | Disposition: A | Discharge status: Home / Self Care | Payer: BC Managed Care – PPO | Source: Ambulatory Visit | Attending: Obstetrics and Gynecology | Admitting: Obstetrics and Gynecology

## 2022-09-18 DIAGNOSIS — Z803 Family history of malignant neoplasm of breast: Secondary | ICD-10-CM

## 2022-09-18 MED ORDER — GADOPICLENOL 0.5 MMOL/ML IV SOLN
7.0000 mL | Freq: Once | INTRAVENOUS | Status: AC | PRN
  Administered 2022-09-18: 7 mL via INTRAVENOUS

## 2023-01-10 ENCOUNTER — Encounter: Payer: BC Managed Care – PPO | Admitting: Family Medicine

## 2023-01-22 ENCOUNTER — Other Ambulatory Visit: Payer: Self-pay | Admitting: Family Medicine

## 2023-04-06 ENCOUNTER — Ambulatory Visit (INDEPENDENT_AMBULATORY_CARE_PROVIDER_SITE_OTHER): Payer: BC Managed Care – PPO | Admitting: Family Medicine

## 2023-04-06 ENCOUNTER — Encounter: Payer: Self-pay | Admitting: Family Medicine

## 2023-04-06 VITALS — BP 133/74 | HR 58 | Temp 97.7°F | Ht 66.5 in | Wt 179.0 lb

## 2023-04-06 DIAGNOSIS — Z0000 Encounter for general adult medical examination without abnormal findings: Secondary | ICD-10-CM | POA: Diagnosis not present

## 2023-04-06 DIAGNOSIS — E041 Nontoxic single thyroid nodule: Secondary | ICD-10-CM

## 2023-04-06 DIAGNOSIS — R739 Hyperglycemia, unspecified: Secondary | ICD-10-CM | POA: Diagnosis not present

## 2023-04-06 DIAGNOSIS — E785 Hyperlipidemia, unspecified: Secondary | ICD-10-CM | POA: Diagnosis not present

## 2023-04-06 DIAGNOSIS — L309 Dermatitis, unspecified: Secondary | ICD-10-CM

## 2023-04-06 DIAGNOSIS — F4329 Adjustment disorder with other symptoms: Secondary | ICD-10-CM

## 2023-04-06 DIAGNOSIS — F4320 Adjustment disorder, unspecified: Secondary | ICD-10-CM | POA: Insufficient documentation

## 2023-04-06 DIAGNOSIS — E663 Overweight: Secondary | ICD-10-CM

## 2023-04-06 DIAGNOSIS — J309 Allergic rhinitis, unspecified: Secondary | ICD-10-CM

## 2023-04-06 DIAGNOSIS — J45990 Exercise induced bronchospasm: Secondary | ICD-10-CM

## 2023-04-06 LAB — CBC
HCT: 41.5 % (ref 36.0–46.0)
Hemoglobin: 13.7 g/dL (ref 12.0–15.0)
MCHC: 33 g/dL (ref 30.0–36.0)
MCV: 90.1 fL (ref 78.0–100.0)
Platelets: 324 10*3/uL (ref 150.0–400.0)
RBC: 4.61 Mil/uL (ref 3.87–5.11)
RDW: 13 % (ref 11.5–15.5)
WBC: 7.6 10*3/uL (ref 4.0–10.5)

## 2023-04-06 LAB — COMPREHENSIVE METABOLIC PANEL
ALT: 21 U/L (ref 0–35)
AST: 19 U/L (ref 0–37)
Albumin: 4 g/dL (ref 3.5–5.2)
Alkaline Phosphatase: 94 U/L (ref 39–117)
BUN: 13 mg/dL (ref 6–23)
CO2: 24 meq/L (ref 19–32)
Calcium: 9.1 mg/dL (ref 8.4–10.5)
Chloride: 109 meq/L (ref 96–112)
Creatinine, Ser: 0.82 mg/dL (ref 0.40–1.20)
GFR: 84.85 mL/min (ref >60.00)
Glucose, Bld: 88 mg/dL (ref 70–99)
Potassium: 4.2 meq/L (ref 3.5–5.1)
Sodium: 140 meq/L (ref 135–145)
Total Bilirubin: 0.6 mg/dL (ref 0.2–1.2)
Total Protein: 6.8 g/dL (ref 6.0–8.3)

## 2023-04-06 LAB — HEMOGLOBIN A1C: Hgb A1c MFr Bld: 5.4 % (ref 4.6–6.5)

## 2023-04-06 LAB — C-REACTIVE PROTEIN: CRP: <1 mg/dL (ref 0.5–20.0)

## 2023-04-06 LAB — TSH: TSH: 2.74 u[IU]/mL (ref 0.35–5.50)

## 2023-04-06 MED ORDER — KETOCONAZOLE 2 % EX CREA: 1.0000 | TOPICAL_CREAM | Freq: Two times a day (BID) | CUTANEOUS | 0 refills | Status: DC

## 2023-04-06 MED ORDER — BUDESONIDE-FORMOTEROL FUMARATE 80-4.5 MCG/ACT IN AERO: INHALATION_SPRAY | RESPIRATORY_TRACT | 1 refills | Status: DC

## 2023-04-06 MED ORDER — AZELASTINE HCL 0.1 % NA SOLN: 2.0000 | Freq: Two times a day (BID) | NASAL | 12 refills | Status: DC

## 2023-04-06 MED ORDER — FLUTICASONE PROPIONATE 50 MCG/ACT NA SUSP: 1.0000 | Freq: Every day | NASAL | 1 refills | Status: DC

## 2023-04-06 MED ORDER — MONTELUKAST SODIUM 10 MG PO TABS: ORAL_TABLET | ORAL | 3 refills | Status: DC

## 2023-04-06 MED ORDER — TRIAMCINOLONE ACETONIDE 0.5 % EX CREA: 1.0000 | TOPICAL_CREAM | Freq: Three times a day (TID) | CUTANEOUS | 3 refills | Status: DC

## 2023-04-06 NOTE — Assessment & Plan Note (Addendum)
Check labs.  Continue lifestyle modifications. Will also check CRP and lipid profile per patient request.  She does have a family history of coronary artery disease.  Mother had her MI when she was in her early 85s.  We discussed additional screening options and patient is interested in cardiac CT scan for further screening.  We will order this today.

## 2023-04-06 NOTE — Assessment & Plan Note (Signed)
Following with allergist.  We will refill her current regimen Singulair, albuterol, and Symbicort which works well for her.

## 2023-04-06 NOTE — Assessment & Plan Note (Signed)
Patient with a lot of stress recently due to the passing of her mother as well as her father's declining health.  She also had her own personal health scare last year with needing a breast biopsy which was thankfully negative.  She is currently seeing a therapist every couple of weeks which is helping.  She will let us know if she is interested in starting pharmacologic therapy at some point the future.

## 2023-04-06 NOTE — Assessment & Plan Note (Signed)
Check TSH 

## 2023-04-06 NOTE — Assessment & Plan Note (Signed)
BMI 28.  Insurance did not pay for GLP-1 agonist.  She is working on lifestyle modifications.

## 2023-04-06 NOTE — Assessment & Plan Note (Signed)
Stable on triamcinolone as needed.  Will refill today.

## 2023-04-06 NOTE — Patient Instructions (Signed)
It was very nice to see you today!  We will check blood work today.  We will order a cardiac CT scan.  Please try the ketoconazole cream on your feet.  Let us know if not improving.  Please continue to work on diet and exercise.  Return in about 1 year (around 04/05/2024) for Annual Physical.   Take care, Dr Jimmey Ralph  PLEASE NOTE:  If you had any lab tests, please let us know if you have not heard back within a few days. You may see your results on mychart before we have a chance to review them but we will give you a call once they are reviewed by [place].   If we ordered any referrals today, please let us know if you have not heard from their office within the next week.   If you had any urgent prescriptions sent in today, please check with the pharmacy within an hour of our visit to make sure the prescription was transmitted appropriately.   Please try these tips to maintain a healthy lifestyle:  Eat at least 3 REAL meals and 1-2 snacks per day.  Aim for no more than 5 hours between eating.  If you eat breakfast, please do so within one hour of getting up.   Each meal should contain half fruits/vegetables, one quarter protein, and one quarter carbs (no bigger than a computer mouse)  Cut down on sweet beverages. This includes juice, soda, and sweet tea.   Drink at least 1 glass of water with each meal and aim for at least 8 glasses per day  Exercise at least 150 minutes every week.

## 2023-04-06 NOTE — Progress Notes (Addendum)
Chief Complaint:  Cindy Pittman is a 47 y.o. female who presents today for her annual comprehensive physical exam.    Assessment/Plan:  New/Acute Problems: Tinea pedis No red flags.  Start ketoconazole.  She will let us know if not improving.  Chronic Problems Addressed Today: Adjustment disorder Patient with a lot of stress recently due to the passing of her mother as well as her father's declining health.  She also had her own personal health scare last year with needing a breast biopsy which was thankfully negative.  She is currently seeing a therapist every couple of weeks which is helping.  She will let us know if she is interested in starting pharmacologic therapy at some point the future.    Dyslipidemia Check labs.  Continue lifestyle modifications. Will also check CRP and lipid profile per patient request.  She does have a family history of coronary artery disease.  Mother had her MI when she was in her early 59s.  We discussed additional screening options and patient is interested in cardiac CT scan for further screening.  We will order this today.    Thyroid nodule Check TSH.   Dermatitis Stable on triamcinolone as needed.  Will refill today.  Exercise induced bronchospasm Following with allergist.  We will refill her current regimen Singulair, albuterol, and Symbicort which works well for her.  Overweight BMI 28.  Insurance did not pay for GLP-1 agonist.  She is working on lifestyle modifications.  Allergic rhinitis Following with allergist.  Has small amount of left middle ear effusion.  Will continue her current regimen with Flonase, Astelin, Singulair, and Zyrtec.  She can try Afrin for a couple of days for her middle ear effusion though discussed that this should not be done for more than a couple days at a time.  She will let us know if not improving.  Preventative Healthcare: Check labs.  Follows with gynecology for women's health.  Up-to-date on  vaccines.  Patient Counseling(The following topics were reviewed and/or handout was given):  -Nutrition: Stressed importance of moderation in sodium/caffeine intake, saturated fat and cholesterol, caloric balance, sufficient intake of fresh fruits, vegetables, and fiber.  -Stressed the importance of regular exercise.   -Substance Abuse: Discussed cessation/primary prevention of tobacco, alcohol, or other drug use; driving or other dangerous activities under the influence; availability of treatment for abuse.   -Injury prevention: Discussed safety belts, safety helmets, smoke detector, smoking near bedding or upholstery.   -Sexuality: Discussed sexually transmitted diseases, partner selection, use of condoms, avoidance of unintended pregnancy and contraceptive alternatives.   -Dental health: Discussed importance of regular tooth brushing, flossing, and dental visits.  -Health maintenance and immunizations reviewed. Please refer to Health maintenance section.  Return to care in 1 year for next preventative visit.     Subjective:  HPI:  See Assessment / plan for status of chronic conditions.   Patient has been dealing with more stress and anxiety over the last year.  Unfortunately her mother passed away a few months ago and she has been going through the grieving process.  She is currently seeing a therapist every 2 weeks which is helping with his.  She also been dealing with declining health of her father who has had multiple medical issues over that time as well.  She has also been having issues with rash in her toes.  This also started with in the last few months.  She tried using over-the-counter Lotrimin with some improvement however rash is  still persisted.   Lifestyle Diet: None specific. Trying to watch her sweets and carbohydrates.  Exercise: None specific.      04/29/2022    7:51 AM  Depression screen PHQ 2/9  Decreased Interest 0  Down, Depressed, Hopeless 0  PHQ - 2 Score 0     Health Maintenance Due  Topic Date Due   Cervical Cancer Screening (HPV/Pap Cotest)  12/06/2021     ROS: Per HPI, otherwise a complete review of systems was negative.   PMH:  The following were reviewed and entered/updated in epic: Past Medical History:  Diagnosis Date   History of ear infections    Hyperlipidemia     Ideal LDL goal = < 70 based on NMR Lipoprofile   Patient Active Problem List   Diagnosis Date Noted   Adjustment disorder 04/06/2023   Dermatitis 01/07/2022   Thyroid nodule 12/29/2020   Overweight 12/23/2019   Allergic rhinitis 12/11/2018   Dyslipidemia 10/12/2018   History of nephrolithiasis 10/12/2018   Exercise induced bronchospasm 06/30/2008   PCOS (polycystic ovarian syndrome) 07/18/2006   Past Surgical History:  Procedure Laterality Date   Labial Cystectomy     LUMBAR DISC SURGERY  1990   L4, Dr Gerlene Fee   Otic Tubes     SEPTOPLASTY     WISDOM TOOTH EXTRACTION      Family History  Problem Relation Age of Onset   Heart attack Mother 8       Smoker   Diabetes Mother    Breast cancer Mother    Diabetes Father    Gout Father    Heart attack Maternal Grandfather 77   Seizures Paternal Grandfather    Diabetes Cousin    Stroke Neg Hx     Medications- reviewed and updated Current Outpatient Medications  Medication Sig Dispense Refill   albuterol (VENTOLIN HFA) 108 (90 Base) MCG/ACT inhaler USE 1 TO 2 PUFFS EVERY 4 TO 6 HOURS AS NEEDED 8.5 each 11   Cholecalciferol (VITAMIN D-3) 25 MCG (1000 UT) CAPS Take by mouth.     ketoconazole (NIZORAL) 2 % cream Apply 1 Application topically 2 (two) times daily. 60 g 0   Levonorgestrel-Ethinyl Estradiol (AMETHIA) 0.1-0.02 & 0.01 MG tablet      azelastine (ASTELIN) 0.1 % nasal spray Place 2 sprays into both nostrils 2 (two) times daily. 30 mL 12   budesonide-formoterol (SYMBICORT) 80-4.5 MCG/ACT inhaler TAKE 2 PUFFS BY MOUTH TWICE A DAY 10.2 each 1   fluticasone (FLONASE) 50 MCG/ACT nasal spray Place  1 spray into both nostrils daily. 48 mL 1   montelukast (SINGULAIR) 10 MG tablet TAKE 1 TABLET BY MOUTH EVERYDAY AT BEDTIME 90 tablet 3   triamcinolone cream (KENALOG) 0.5 % Apply 1 Application topically 3 (three) times daily. 30 g 3   No current facility-administered medications for this visit.    Allergies-reviewed and updated Allergies  Allergen Reactions   Sulfonamide Derivatives     Rash as child    Social History   Socioeconomic History   Marital status: Single    Spouse name: Not on file   Number of children: Not on file   Years of education: Not on file   Highest education level: Not on file  Occupational History   Not on file  Tobacco Use   Smoking status: Never   Smokeless tobacco: Never  Substance and Sexual Activity   Alcohol use: Yes    Comment: Rarely   Drug use: No   Sexual activity: Not  on file  Other Topics Concern   Not on file  Social History Narrative   Not on file   Social Determinants of Health   Financial Resource Strain: Not on file  Food Insecurity: Not on file  Transportation Needs: Not on file  Physical Activity: Not on file  Stress: Not on file  Social Connections: Not on file        Objective:  Physical Exam: BP 133/74   Pulse (!) 58   Temp 97.7 F (36.5 C) (Temporal)   Ht 5' 6.5" (1.689 m)   Wt 179 lb (81.2 kg)   SpO2 98%   BMI 28.46 kg/m   Body mass index is 28.46 kg/m. Wt Readings from Last 3 Encounters:  04/06/23 179 lb (81.2 kg)  04/29/22 179 lb 3.2 oz (81.3 kg)  01/07/22 178 lb 9.6 oz (81 kg)   Gen: NAD, resting comfortably HEENT: Left TM with clear effusion.  Right TM clear. OP clear. No thyromegaly noted.  CV: RRR with no murmurs appreciated Pulm: NWOB, CTAB with no crackles, wheezes, or rhonchi GI: Normal bowel sounds present. Soft, Nontender, Nondistended. MSK: no edema, cyanosis, or clubbing noted Skin: warm, dry Neuro: CN2-12 grossly intact. Strength 5/5 in upper and lower extremities. Reflexes symmetric  and intact bilaterally.  Psych: Normal affect and thought content     Corrion Stirewalt M. Jimmey Ralph, MD 04/06/2023 8:26 AM

## 2023-04-06 NOTE — Assessment & Plan Note (Signed)
Following with allergist.  Has small amount of left middle ear effusion.  Will continue her current regimen with Flonase, Astelin, Singulair, and Zyrtec.  She can try Afrin for a couple of days for her middle ear effusion though discussed that this should not be done for more than a couple days at a time.  She will let us know if not improving.

## 2023-04-08 LAB — NMR, LIPOPROFILE
Cholesterol, Total: 206 mg/dL — ABNORMAL HIGH (ref 100–199)
HDL Particle Number: 40.1 umol/L (ref >=30.5)
HDL-C: 55 mg/dL (ref >39)
LDL Particle Number: 1576 nmol/L — ABNORMAL HIGH (ref <1000)
LDL Size: 20.9 nm (ref >20.5)
LDL-C (NIH Calc): 130 mg/dL — ABNORMAL HIGH (ref 0–99)
LP-IR Score: 52 — ABNORMAL HIGH (ref <=45)
Small LDL Particle Number: 465 nmol/L (ref <=527)
Triglycerides: 119 mg/dL (ref 0–149)

## 2023-04-11 NOTE — Progress Notes (Signed)
Her labs show that her cholesterol is mildly elevated and slightly more than last year.  Everything else is normal.  Do not need to make any changes to her treatment plan at this time though we will contact her once we get results back from her cardiac CT scan.  She should continue to work on diet and exercise and we can recheck everything in a year.

## 2023-04-14 ENCOUNTER — Other Ambulatory Visit: Payer: Self-pay | Admitting: Medical Genetics

## 2023-04-14 DIAGNOSIS — Z006 Encounter for examination for normal comparison and control in clinical research program: Secondary | ICD-10-CM

## 2023-04-18 ENCOUNTER — Ambulatory Visit (HOSPITAL_COMMUNITY)
Admission: RE | Admit: 2023-04-18 | Discharge: 2023-04-18 | Disposition: A | Discharge status: Home / Self Care | Payer: BC Managed Care – PPO | Source: Ambulatory Visit | Attending: Family Medicine | Admitting: Family Medicine

## 2023-04-18 DIAGNOSIS — Z0000 Encounter for general adult medical examination without abnormal findings: Secondary | ICD-10-CM | POA: Insufficient documentation

## 2023-04-18 DIAGNOSIS — E785 Hyperlipidemia, unspecified: Secondary | ICD-10-CM | POA: Insufficient documentation

## 2023-04-20 NOTE — Progress Notes (Signed)
Great news!  Her CT scan is normal.  No signs of calcification.  Do not need to make any adjustments to her treatment plan at this time.  She should continue to work on diet and exercise and we can repeat this scan in 5 to 10 years.

## 2023-05-04 IMAGING — US US THYROID
1 series · 13 of 25 positions shown · non-contrast
Comparison: 12/20/2019; ultrasound-guided right thyroid nodule
fine-needle aspiration-01/28/2020

CLINICAL DATA: Prior ultrasound follow-up. Follow-up right-sided
thyroid nodule. History of right-sided thyroid nodule fine-needle
aspiration performed 01/28/2019

EXAM:
THYROID ULTRASOUND
TECHNIQUE: Ultrasound examination of the thyroid gland and adjacent soft
tissues was performed.

[Series 1: us thyroid · 0.06mm/px · 13 of 35 slices shown]
[im 1/35]
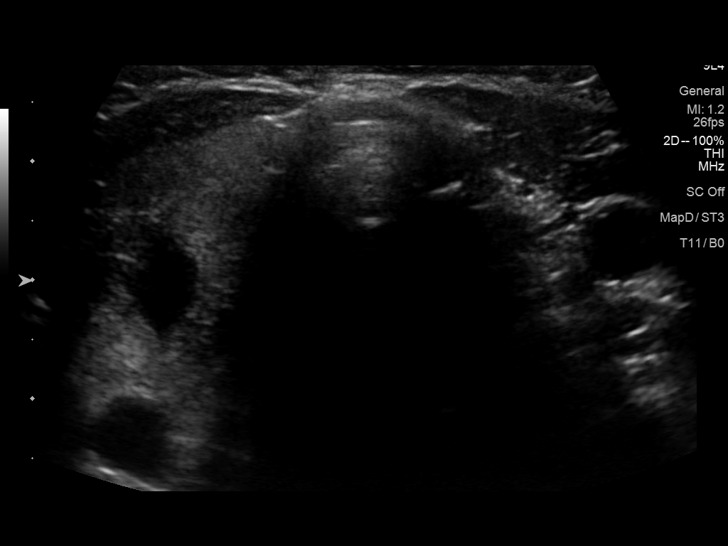
[im 3/35]
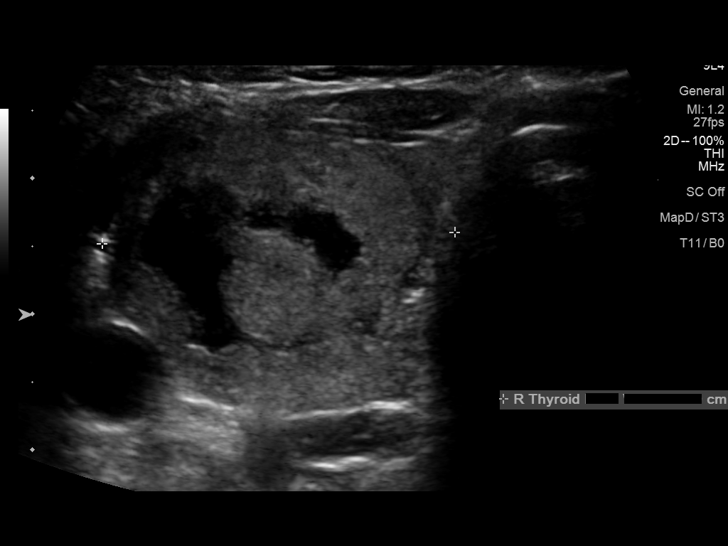
[im 6/35]
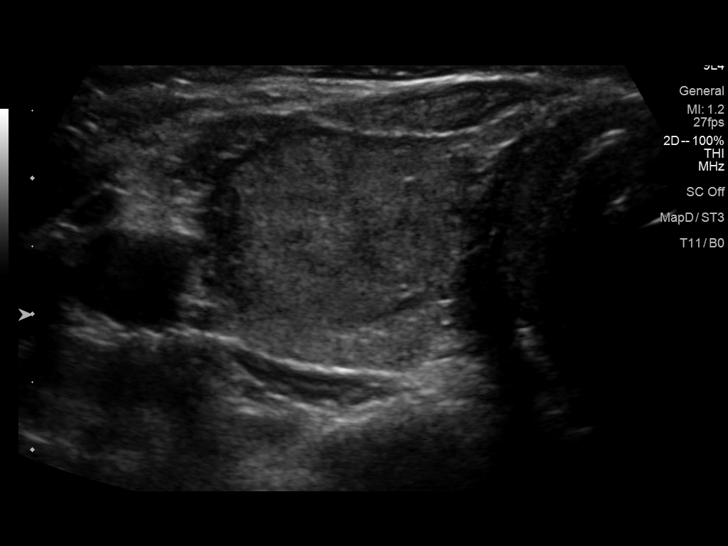
[im 9/35]
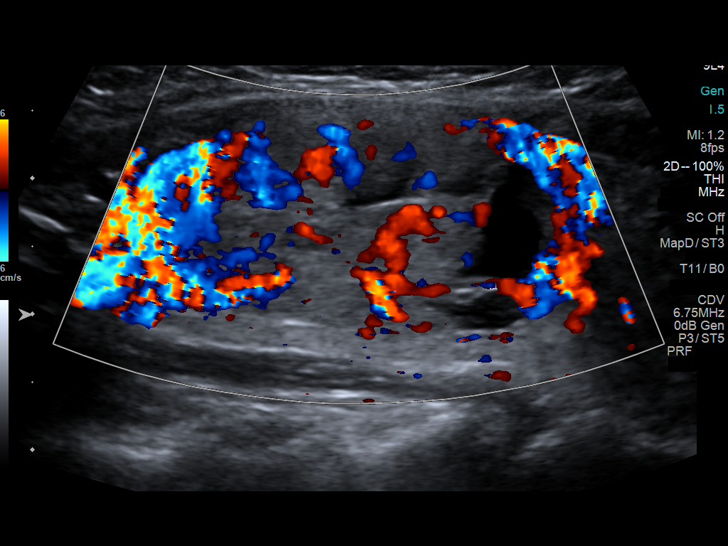
[im 12/35]
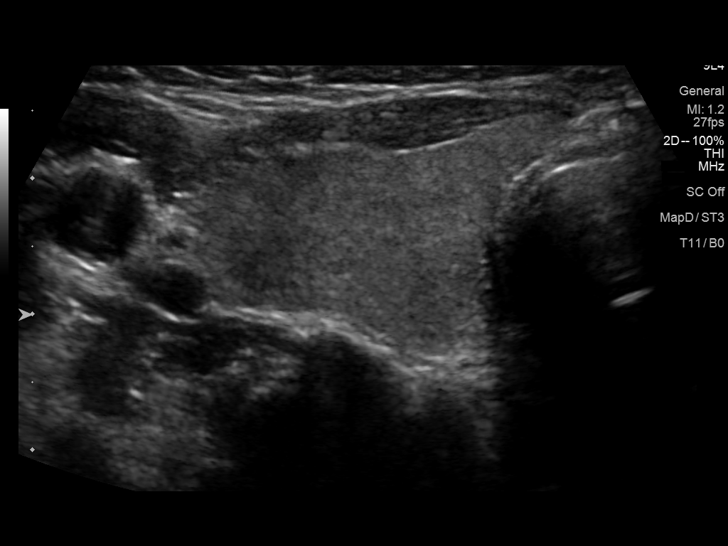
[im 15/35]
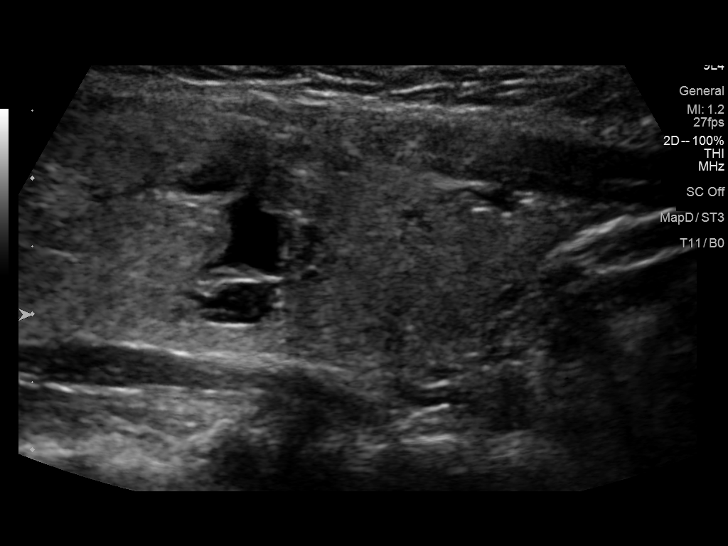
[im 18/35]
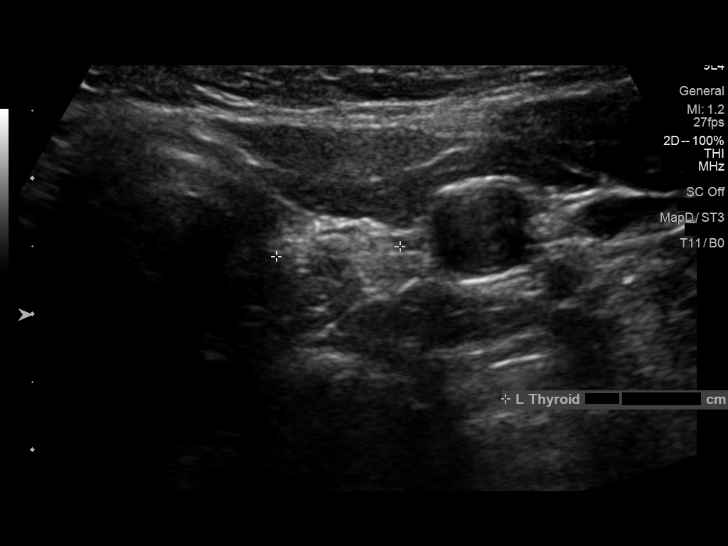
[im 20/35]
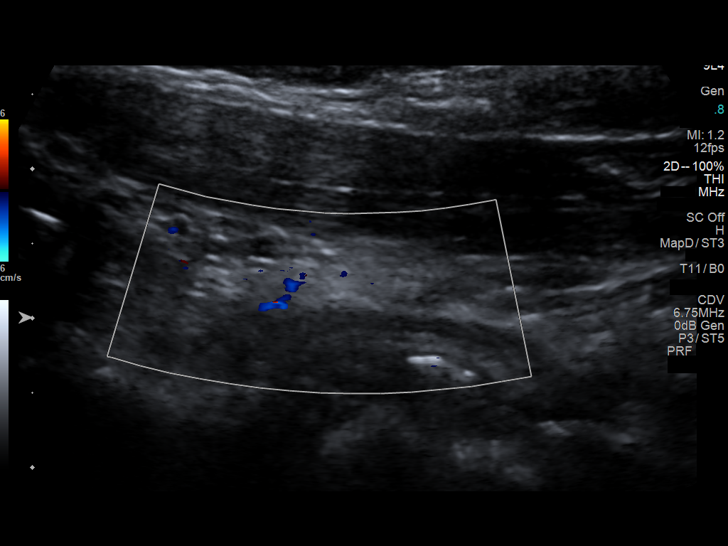
[im 23/35]
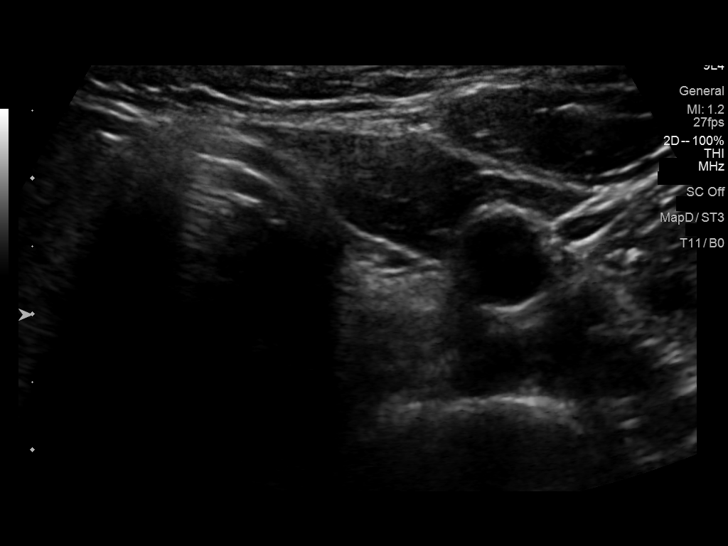
[im 26/35]
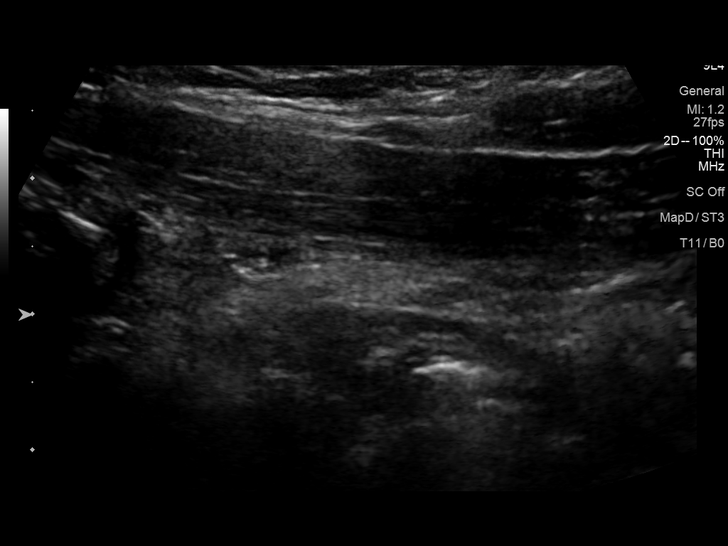
[im 29/35]
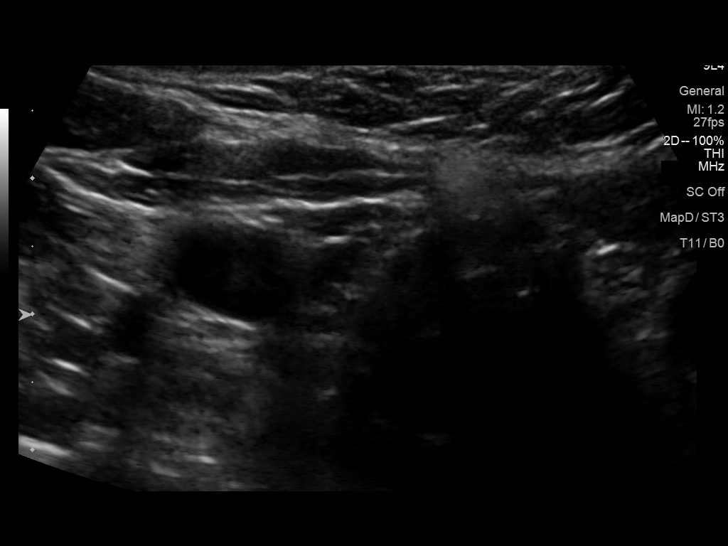
[im 32/35]
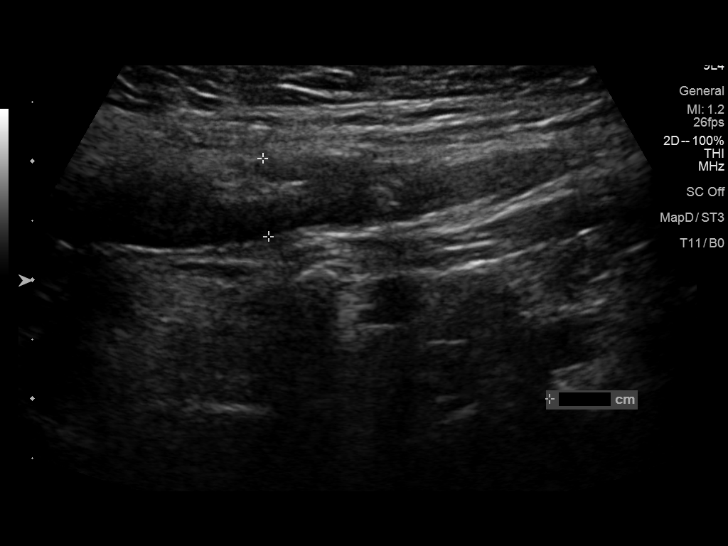
[im 35/35]
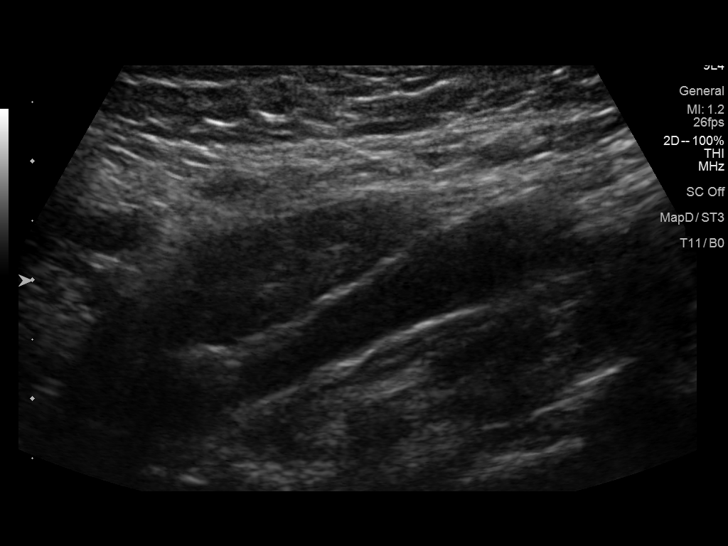

[13 of 25 positions shown; findings below may reference images not displayed]

FINDINGS: Parenchymal Echotexture: Mildly heterogenous

Isthmus: Normal in size measures 0.2 cm in diameter, unchanged

Right lobe: Borderline enlarged measuring 5.0 x 1.9 x 2.6 cm,
previously, 5.0 x 2.1 x 2.4 cm

Left lobe: Atrophic measuring 2.0 x 0.5 x 0.9 cm, previously, 2.4 x
0.5 x 1.7 cm

_________________________________________________________

Estimated total number of nodules >/= 1 cm: 1

Number of spongiform nodules >/=  2 cm not described below (TR1): 0

Number of mixed cystic and solid nodules >/= 1.5 cm not described
below (TR2): 0

_________________________________________________________

The previously biopsied approximately 3.0 x 2.4 x 1.8 cm partially
cystic though predominantly solid isoechoic nodule/mass replacing
near the entirety of the right lobe of the thyroid is grossly
unchanged compared to the [DATE] examination previously, 3.1 x
x 1.6 cm, with slight differences in size attributable to interval
partial cystic degeneration, a typically benign finding. Correlation
with previous biopsy results is advised.
IMPRESSION: 1. Similar appearing atrophic thyroid without new or enlarging
thyroid nodule.
2. Grossly unchanged appearance of previously biopsied solitary mass
replacing near the entirety of the right lobe of the thyroid.
Assuming a benign pathologic diagnosis, repeat sampling and/or
continued dedicated follow-up is not recommended.

The above is in keeping with the ACR TI-RADS recommendations - [HOSPITAL] 8938;[DATE].

## 2023-05-10 ENCOUNTER — Other Ambulatory Visit (HOSPITAL_COMMUNITY)
Admission: RE | Admit: 2023-05-10 | Discharge: 2023-05-10 | Disposition: A | Discharge status: Home / Self Care | Payer: BC Managed Care – PPO | Source: Ambulatory Visit | Attending: Medical Genetics | Admitting: Medical Genetics

## 2023-05-10 DIAGNOSIS — Z006 Encounter for examination for normal comparison and control in clinical research program: Secondary | ICD-10-CM | POA: Insufficient documentation

## 2023-05-19 ENCOUNTER — Encounter (INDEPENDENT_AMBULATORY_CARE_PROVIDER_SITE_OTHER): Payer: Self-pay | Admitting: Otolaryngology

## 2023-05-22 LAB — GENECONNECT MOLECULAR SCREEN: Genetic Analysis Overall Interpretation: NEGATIVE

## 2023-06-25 ENCOUNTER — Other Ambulatory Visit: Payer: Self-pay | Admitting: Family Medicine

## 2023-06-25 DIAGNOSIS — J45990 Exercise induced bronchospasm: Secondary | ICD-10-CM

## 2023-06-27 ENCOUNTER — Telehealth (INDEPENDENT_AMBULATORY_CARE_PROVIDER_SITE_OTHER): Payer: Self-pay | Admitting: Otolaryngology

## 2023-06-27 NOTE — Telephone Encounter (Signed)
 Confirmed appt & location [id number] afm

## 2023-06-28 ENCOUNTER — Ambulatory Visit (INDEPENDENT_AMBULATORY_CARE_PROVIDER_SITE_OTHER): Payer: 59 | Admitting: Otolaryngology

## 2023-06-28 ENCOUNTER — Encounter (INDEPENDENT_AMBULATORY_CARE_PROVIDER_SITE_OTHER): Payer: Self-pay

## 2023-06-28 VITALS — BP 138/89 | HR 67 | Ht 67.5 in | Wt 184.0 lb

## 2023-06-28 DIAGNOSIS — R0981 Nasal congestion: Secondary | ICD-10-CM | POA: Diagnosis not present

## 2023-06-28 DIAGNOSIS — J310 Chronic rhinitis: Secondary | ICD-10-CM | POA: Diagnosis not present

## 2023-06-28 DIAGNOSIS — J324 Chronic pansinusitis: Secondary | ICD-10-CM

## 2023-06-28 DIAGNOSIS — J343 Hypertrophy of nasal turbinates: Secondary | ICD-10-CM | POA: Diagnosis not present

## 2023-06-29 DIAGNOSIS — J324 Chronic pansinusitis: Secondary | ICD-10-CM | POA: Insufficient documentation

## 2023-06-29 DIAGNOSIS — J343 Hypertrophy of nasal turbinates: Secondary | ICD-10-CM | POA: Insufficient documentation

## 2023-06-29 NOTE — Progress Notes (Signed)
Patient ID: Cindy Pittman, female   DOB: 09-Feb-1976, 48 y.o.   MRN: [id number]  CC: Recurrent sinusitis, chronic nasal congestion  HPI:  Cindy Pittman is a 48 y.o. female who presents today complaining of frequent recurrent sinusitis and chronic nasal congestion.  She has been symptomatic for many years.  Her symptoms include facial pain and pressure, headaches, clogging sensation in her ears, and chronic nasal obstruction.  The patient has a history of septoplasty and bilateral myringotomy and tube placement.  She also has a history of environmental allergies.  She was treated with immunotherapy for the past year.  Currently she is on azelastine, Flonase, Singulair, and Zyrtec.  Despite the extensive medical treatment, she continues to be symptomatic.  She has no recent sinus CT scan.  Past Medical History:  Diagnosis Date   History of ear infections    Hyperlipidemia     Ideal LDL goal = < 70 based on NMR Lipoprofile    Past Surgical History:  Procedure Laterality Date   Labial Cystectomy     LUMBAR DISC SURGERY  1990   L4, Dr Gerlene Fee   Otic Tubes     SEPTOPLASTY     WISDOM TOOTH EXTRACTION      Family History  Problem Relation Age of Onset   Heart attack Mother 93       Smoker   Diabetes Mother    Breast cancer Mother    Diabetes Father    Gout Father    Heart attack Maternal Grandfather 57   Seizures Paternal Grandfather    Diabetes Cousin    Stroke Neg Hx     Social History:  reports that she has never smoked. She has never used smokeless tobacco. She reports current alcohol use. She reports that she does not use drugs.  Allergies:  Allergies  Allergen Reactions   Sulfonamide Derivatives     Rash as child    Prior to Admission medications   Medication Sig Start Date End Date Taking? Authorizing Provider  albuterol (VENTOLIN HFA) 108 (90 Base) MCG/ACT inhaler USE 1 TO 2 PUFFS EVERY 4 TO 6 HOURS AS NEEDED 06/26/23  Yes Ardith Dark, MD  azelastine  (ASTELIN) 0.1 % nasal spray Place 2 sprays into both nostrils 2 (two) times daily. 04/06/23  Yes Ardith Dark, MD  budesonide-formoterol [facility]) 80-4.5 MCG/ACT inhaler TAKE 2 PUFFS BY MOUTH TWICE A DAY 04/06/23  Yes Ardith Dark, MD  Cholecalciferol (VITAMIN D-3) 25 MCG (1000 UT) CAPS Take by mouth.   Yes [provider]  fluticasone (FLONASE) 50 MCG/ACT nasal spray Place 1 spray into both nostrils daily. 04/06/23  Yes Ardith Dark, MD  ketoconazole (NIZORAL) 2 % cream Apply 1 Application topically 2 (two) times daily. 04/06/23  Yes Ardith Dark, MD  Levonorgestrel-Ethinyl Estradiol (AMETHIA) 0.1-0.02 & 0.01 MG tablet  08/25/18  Yes [provider]  montelukast (SINGULAIR) 10 MG tablet TAKE 1 TABLET BY MOUTH EVERYDAY AT BEDTIME 04/06/23  Yes Ardith Dark, MD  triamcinolone cream (KENALOG) 0.5 % Apply 1 Application topically 3 (three) times daily. 04/06/23   Ardith Dark, MD    Blood pressure 138/89, pulse 67, height 5' 7.5" (1.715 m), weight 184 lb (83.5 kg), SpO2 98%. Exam: General: Communicates without difficulty, well nourished, no acute distress. Head: Normocephalic, no evidence injury, no tenderness, facial buttresses intact without stepoff. Face/sinus: No tenderness to palpation and percussion. Facial movement is normal and symmetric. Eyes: PERRL, EOMI. No scleral icterus,  conjunctivae clear. Neuro: CN II exam reveals vision grossly intact.  No nystagmus at any point of gaze. Ears: Auricles well formed without lesions.  Ear canals are intact without mass or lesion.  No erythema or edema is appreciated.  The TMs are intact without fluid. Nose: External evaluation reveals normal support and skin without lesions.  Dorsum is intact.  Anterior rhinoscopy reveals congested mucosa over anterior aspect of inferior turbinates and intact septum.  No purulence noted. Oral:  Oral cavity and oropharynx are intact, symmetric, without erythema or edema.  Mucosa is moist  without lesions. Neck: Full range of motion without pain.  There is no significant lymphadenopathy.  No masses palpable.  Thyroid bed within normal limits to palpation.  Parotid glands and submandibular glands equal bilaterally without mass.  Trachea is midline. Neuro:  CN 2-12 grossly intact.   Procedure:  Flexible Nasal Endoscopy: Description: Risks, benefits, and alternatives of flexible endoscopy were explained to the patient.  Specific mention was made of the risk of throat numbness with difficulty swallowing, possible bleeding from the nose and mouth, and pain from the procedure.  The patient gave oral consent to proceed.  The flexible scope was inserted into the right nasal cavity.  Endoscopy of the interior nasal cavity, superior, inferior, and middle meatus was performed. The sphenoid-ethmoid recess was examined. Edematous mucosa was noted.  No polyp, mass, or lesion was appreciated. Olfactory cleft was clear.  Nasopharynx was clear.  Turbinates were hypertrophied but without mass.  The procedure was repeated on the contralateral side with similar findings.  The patient tolerated the procedure well.    Assessment: 1.  Chronic rhinitis with nasal mucosal congestion and bilateral inferior turbinate hypertrophy. 2.  History of frequent recurrent rhinosinusitis.  However, no acute infection is noted today.  Plan: 1.  The physical exam and nasal endoscopy findings are reviewed with the patient. 2.  The patient is reassured and no acute infection is noted today. 3.  Continue with her allergy treatment regimen of immunotherapy, Flonase, azelastine, Singulair, and Zyrtec. 4.  Sinus CT scan to evaluate for chronic rhinosinusitis. 5.  The patient will return for reevaluation after her CT scan.  Cindy Pittman Cindy Pittman 06/29/2023, 11:46 AM

## 2023-07-31 ENCOUNTER — Ambulatory Visit (HOSPITAL_COMMUNITY)
Admission: RE | Admit: 2023-07-31 | Discharge: 2023-07-31 | Disposition: A | Discharge status: Home / Self Care | Payer: 59 | Source: Ambulatory Visit | Attending: Otolaryngology | Admitting: Otolaryngology

## 2023-07-31 DIAGNOSIS — J324 Chronic pansinusitis: Secondary | ICD-10-CM | POA: Diagnosis present

## 2023-08-08 ENCOUNTER — Encounter (INDEPENDENT_AMBULATORY_CARE_PROVIDER_SITE_OTHER): Payer: Self-pay

## 2023-08-08 ENCOUNTER — Ambulatory Visit (INDEPENDENT_AMBULATORY_CARE_PROVIDER_SITE_OTHER): Payer: 59

## 2023-08-08 VITALS — BP 134/86 | HR 62 | Ht 67.0 in | Wt 184.0 lb

## 2023-08-08 DIAGNOSIS — J3489 Other specified disorders of nose and nasal sinuses: Secondary | ICD-10-CM | POA: Diagnosis not present

## 2023-08-08 DIAGNOSIS — R0981 Nasal congestion: Secondary | ICD-10-CM | POA: Diagnosis not present

## 2023-08-08 DIAGNOSIS — J343 Hypertrophy of nasal turbinates: Secondary | ICD-10-CM

## 2023-08-08 DIAGNOSIS — J310 Chronic rhinitis: Secondary | ICD-10-CM | POA: Insufficient documentation

## 2023-08-08 DIAGNOSIS — R040 Epistaxis: Secondary | ICD-10-CM | POA: Insufficient documentation

## 2023-08-09 ENCOUNTER — Ambulatory Visit: Payer: 59 | Attending: Obstetrics and Gynecology

## 2023-08-09 ENCOUNTER — Other Ambulatory Visit: Payer: Self-pay

## 2023-08-09 DIAGNOSIS — R279 Unspecified lack of coordination: Secondary | ICD-10-CM | POA: Diagnosis present

## 2023-08-09 DIAGNOSIS — M62838 Other muscle spasm: Secondary | ICD-10-CM | POA: Insufficient documentation

## 2023-08-09 DIAGNOSIS — R293 Abnormal posture: Secondary | ICD-10-CM | POA: Insufficient documentation

## 2023-08-09 DIAGNOSIS — M6281 Muscle weakness (generalized): Secondary | ICD-10-CM | POA: Diagnosis present

## 2023-08-09 NOTE — Patient Instructions (Signed)
Urge Incontinence  Ideal urination frequency is every 2-4 wakeful hours, which equates to 5-8 times within a 24-hour period.   Urge incontinence is leakage that occurs when the bladder muscle contracts, creating a sudden need to go before getting to the bathroom.   Going too often when your bladder isn't actually full can disrupt the body's automatic signals to store and hold urine longer, which will increase urgency/frequency.  In this case, the bladder "is running the show" and strategies can be learned to retrain this pattern.   One should be able to control the first urge to urinate, at around 150mL.  The bladder can hold up to a "grande latte," or 400mL. To help you gain control, practice the Urge Drill below when urgency strikes.  This drill will help retrain your bladder signals and allow you to store and hold urine longer.  The overall goal is to stretch out your time between voids to reach a more manageable voiding schedule.    Practice your "quick flicks" often throughout the day (each waking hour) even when you don't need feel the urge to go.  This will help strengthen your pelvic floor muscles, making them more effective in controlling leakage.  Urge Drill  When you feel an urge to go, follow these steps to regain control: Stop what you are doing and be still Take one deep breath, directing your air into your abdomen Think an affirming thought, such as "I've got this." Do 5 quick flicks of your pelvic floor Walk with control to the bathroom to void, or delay voiding        The knack: Use this technique while coughing, laughing, sneezing, or with any activities that causes you to leak urine a little. Right before you perform one of these activities that increase pressure in the abdomen and pushes a little urine out, perform a pelvic floor muscle contraction and hold. If that does not completely stop the leaking, try tightening your thighs together in addition to performing a  pelvic floor muscle contraction. Make sure you are not trying to stifle a cough, sneeze, or laugh; allow these activities in full as it will cause less pressure down into the bladder and pelvic floor muscles.       [facility] [address], [address] [place], [place] [address] Phone # [phone] Fax [phone]  

## 2023-08-09 NOTE — Therapy (Signed)
 OUTPATIENT PHYSICAL THERAPY FEMALE PELVIC EVALUATION   Patient Name: Cindy Pittman MRN: [id number] DOB:1975/10/05, 48 y.o., female Today's Date: 08/09/2023  END OF SESSION:  PT End of Session - 08/09/23 1516     Visit Number 1    Date for PT Re-Evaluation 01/24/24    Authorization Type [employer]    PT Start Time 1530    PT Stop Time 1610    PT Time Calculation (min) 40 min    Activity Tolerance Patient tolerated treatment well    Behavior During Therapy WFL for tasks assessed/performed             Past Medical History:  Diagnosis Date   History of ear infections    Hyperlipidemia     Ideal LDL goal = < 70 based on NMR Lipoprofile   Past Surgical History:  Procedure Laterality Date   Labial Cystectomy     LUMBAR DISC SURGERY  1990   L4, Dr Gerlene Fee   Otic Tubes     SEPTOPLASTY     WISDOM TOOTH EXTRACTION     Patient Active Problem List   Diagnosis Date Noted   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   Chronic rhinitis 08/08/2023   Epistaxis 08/08/2023   Chronic pansinusitis 06/29/2023   Hypertrophy of nasal turbinates 06/29/2023   Adjustment disorder 04/06/2023   Dermatitis 01/07/2022   Thyroid nodule 12/29/2020   Overweight 12/23/2019   Allergic rhinitis 12/11/2018   Dyslipidemia 10/12/2018   History of nephrolithiasis 10/12/2018   Exercise induced bronchospasm 06/30/2008   PCOS (polycystic ovarian syndrome) 07/18/2006    PCP: Ardith Dark, MD  REFERRING PROVIDER: Ardith Dark, MD Zelphia Cairo, MD   REFERRING DIAG: N39.3 (ICD-10-CM) - Stress incontinence (female) (female)  THERAPY DIAG:  Muscle weakness (generalized)  Unspecified lack of coordination  Abnormal posture  Rationale for Evaluation and Treatment: Rehabilitation  ONSET DATE: 1 year  SUBJECTIVE:                                                                                                                                                                                            SUBJECTIVE STATEMENT: Pt was playing tennis a year ago and jumped and landed, having an episode of urinary incontinence. She noticed leaking with coughing and sneezing after that.  Fluid intake: 20-30oz a day of water; 3-4 cans of diet dr. Reino Kent  PAIN:  Are you having pain? Yes NPRS scale: 6-7/10 Pain location:  Lt hip (anterior/posterior)  Pain type: aching Pain description: intermittent   Aggravating factors: sitting all day, not being as active Relieving factors: being active   PRECAUTIONS: None  RED FLAGS:  None   WEIGHT BEARING RESTRICTIONS: No  FALLS:  Has patient fallen in last 6 months? No  OCCUPATION: high school counselor   ACTIVITY LEVEL : tennis, walking, running   PLOF: Independent  PATIENT GOALS: get back to running and playing tennis without leaking  PERTINENT HISTORY:  Labial cystectomy, lumbar disc surgery, PCOS, endometriosis, abdominal laparoscopy years ago for endo   BOWEL MOVEMENT: Pain with bowel movement: No Type of bowel movement:Frequency 2x/day and Strain no Fully empty rectum: Yes:   Leakage: No Pads: No Fiber supplement/laxative No  URINATION: Pain with urination: No Fully empty bladder: Yes: - Stream: Strong Urgency: Yes  Frequency: every hour when drinking a lot of water, currently every 3-4 hours; no usual nocturia  Leakage: Urge to void, Walking to the bathroom, Coughing, Sneezing, Laughing, and jumping Pads: No  INTERCOURSE:  Not sexually active, but has been painful in the past  PREGNANCY: NA  PROLAPSE: None   OBJECTIVE:  Note: Objective measures were completed at Evaluation unless otherwise noted. 08/09/23:   COGNITION: Overall cognitive status: Within functional limits for tasks assessed     SENSATION: Light touch: Appears intact  FUNCTIONAL TESTS:  Squat: Lt LE ER, tightness on LT side and small weight shift to the Lt Single leg stance:  Rt: Lt pelvic drop  Lt: Rt pelvic drop Curl-up test: upper  abdominal distortion    GAIT: Assistive device utilized: None Comments: WNL  POSTURE: rounded shoulders, forward head, increased thoracic kyphosis, and Rt posterior rotation, Lt iliac crest elevation, increased muscle tone in Rt lumbar/Lt thoracic paraspinals   LUMBARAROM/PROM:  A/PROM A/PROM  Eval (% available)  Flexion 75  Extension 50  Right lateral flexion 100  Left lateral flexion 75  Right rotation 75  Left rotation 50   (Blank rows = not tested)  PALPATION:  Pelvic Alignment: Rt posterior/Lt anterior rotation   Abdominal: Lt lower quadrant tenderness; lower abdominal restriction                External Perineal Exam: WNL                             Internal Pelvic Floor: tenderness and restriction throughout Lt deep pelvic floor muscles  Patient confirms identification and approves PT to assess internal pelvic floor and treatment Yes  PELVIC MMT:   MMT eval  Vaginal 2/5, 3 second hold, 8 repeat contractions  Diastasis Recti WNL, but distortion  (Blank rows = not tested)        TONE: High Lt levator ani  PROLAPSE: WNL  TODAY'S TREATMENT:                                                                                                                              DATE:  08/09/23  EVAL  Neuromuscular re-education: Pt provides verbal consent for internal vaginal/rectal pelvic floor exam. Internal vaginal pelvic floor muscle contraction training  Long holds Quick flicks The knack Urge drill  PATIENT EDUCATION:  Education details: See above Person educated: Patient Education method: Explanation, Demonstration, Tactile cues, Verbal cues, and Handouts Education comprehension: verbalized understanding  HOME EXERCISE PROGRAM: [id number]  ASSESSMENT:  CLINICAL IMPRESSION: Patient is a 48 y.o. female who was seen today for physical therapy evaluation and treatment for urinary incontinence and Lt hip pain. Exam findings notable for abnormal posture,  decreased lumbar A/ROM, abnormal squat, pelvic drop during single leg stance, abdominal weakness with distortion in upper abdominals during curl-up test, tenderness and restriction in lower abdomen, pelvic floor muscle weakness, decreased pelvic floor muscle endurance, and high tone Lt levator ani with discomfort. Signs and symptoms are most consistent with pelvic floor muscle tension and weakness. Initial treatment consisted of pelvic floor muscle contraction training, urge drill, and the knack. She will continue to benefit from skilled PT intervention in order to decrease Lt hip pain, improve urinary incontinence, return to running/tennis, and begin/progress functional strengthening program.   OBJECTIVE IMPAIRMENTS: decreased activity tolerance, decreased coordination, decreased endurance, decreased mobility, decreased strength, increased fascial restrictions, increased muscle spasms, impaired tone, postural dysfunction, and pain.   ACTIVITY LIMITATIONS: sitting, continence, and running/jumping  PARTICIPATION LIMITATIONS: community activity and exercise (tennis)  PERSONAL FACTORS: 3+ comorbidities: medical history  are also affecting patient's functional outcome.   REHAB POTENTIAL: Good  CLINICAL DECISION MAKING: Stable/uncomplicated  EVALUATION COMPLEXITY: Low   GOALS: Goals reviewed with patient? Yes  SHORT TERM GOALS: Target date: 09/06/2023   Pt will be independent with HEP.   Baseline: Goal status: INITIAL  2.  Pt will be independent with the knack, urge suppression technique, and double voiding in order to improve bladder habits and decrease urinary incontinence.   Baseline:  Goal status: INITIAL  3.  Pt will report 25% improvement in urinary incontinence and urgency.  Baseline:  Goal status: INITIAL  4.  Pt will increase pelvic floor muscle strength to 3/5.  Baseline:  Goal status: INITIAL   LONG TERM GOALS: Target date: 01/24/24  Pt will be independent with  advanced HEP.   Baseline:  Goal status: INITIAL  2.  Pt will report 755 improvement in urinary incontinence. Baseline:  Goal status: INITIAL  3.  Pt will be able to go 2-3 hours in between voids without urgency or incontinence in order to improve QOL and perform all functional activities with less difficulty.   Baseline:  Goal status: INITIAL  4.  Pt will be able to run, jump, and return to playing tennis without concerning issues with urinary incontinence.  Baseline:  Goal status: INITIAL  5.  Pt will report no leaks with laughing, coughing, sneezing in order to improve comfort with interpersonal relationships and community activities.   Baseline:  Goal status: INITIAL  6.  Pt will improve pelvic floor muscle strength to 4/5.  Baseline:  Goal status: INITIAL  PLAN:  PT FREQUENCY: 1-2x/week  PT DURATION: 6 months  PLANNED INTERVENTIONS: 97110-Therapeutic exercises, 97530- Therapeutic activity, 97112- Neuromuscular re-education, 97535- Self Care, 16109- Manual therapy, Dry Needling, and Biofeedback  PLAN FOR NEXT SESSION: Begin core strengthening program.    Julio Alm, PT, DPT02/26/253:17 PM

## 2023-08-09 NOTE — Progress Notes (Signed)
 Patient ID: Cindy Pittman, female   DOB: Nov 09, 1975, 48 y.o.   MRN: [id number]  Follow-up: Chronic nasal obstruction, recurrent sinusitis New complaint: Recurrent left epistaxis  HPI: The patient is a 48 year old female who returns today for her follow-up evaluation.  She was last seen in January 2025.  At that time, she was complaining of recurrent sinusitis and chronic nasal obstruction.  She has been symptomatic for many years.  She was treated with immunotherapy, azelastine, Flonase, Singulair, and Zyrtec.  Despite the treatment, she continues to be symptomatic.  She recently underwent a sinus CT scan.  The CT showed no significant acute or chronic sinusitis.  However she is noted to have a large right concha bullosa with bilateral inferior turbinate hypertrophy.  The patient returns today complaining of persistent nasal obstruction and facial pressure.  In addition, she also complains of frequent recurrent epistaxis.  She has been symptomatic for several years.  However, her symptoms have significantly worsened over the past month.  She has been having multiple bleeding episodes every week.  The bleeding is bilateral, but worse on the left side.  Exam: General: Communicates without difficulty, well nourished, no acute distress. Head: Normocephalic, no evidence injury, no tenderness, facial buttresses intact without stepoff. Face/sinus: No tenderness to palpation and percussion. Facial movement is normal and symmetric. Eyes: PERRL, EOMI. No scleral icterus, conjunctivae clear. Neuro: CN II exam reveals vision grossly intact.  No nystagmus at any point of gaze. Ears: Auricles well formed without lesions.  Ear canals are intact without mass or lesion.  No erythema or edema is appreciated.  The TMs are intact without fluid. Nose: External evaluation reveals normal support and skin without lesions.  Dorsum is intact.  Anterior rhinoscopy reveals congested mucosa over anterior aspect of inferior turbinates  and intact septum.  Oral:  Oral cavity and oropharynx are intact, symmetric, without erythema or edema.  Mucosa is moist without lesions. Neck: Full range of motion without pain.  There is no significant lymphadenopathy.  No masses palpable.  Thyroid bed within normal limits to palpation.  Parotid glands and submandibular glands equal bilaterally without mass.  Trachea is midline. Neuro:  CN 2-12 grossly intact.   Procedure:  Endoscopic control of recurrent left epistaxis. Indication:  Recurrent epistaxis  Description:  The left nasal cavity is sprayed with topical xylocaine and neo-synephrine.  After adequate anesthesia is achieved, the nasal cavity is examined with a 0 rigid endoscope.  A suction catheter is inserted into parallel with the 0 endoscope, and it is used to suction blood clots from the nasal cavity.  Several hypervascular areas are noted on the anterior and superior portion of the septum. Active bleeding is noted. A silver nitrate stick is inserted in parallel with the 0 endoscope.  It is used to repeatedly cauterized the hypervascular areas.  Good hemostasis is achieved.  The patient tolerated the procedure well.    Assessment: 1.  Chronic rhinitis with nasal mucosal congestion, bilateral inferior turbinate hypertrophy, and large right concha bullosa.  More than 95% of her nasal passageways are obstructed bilaterally. 2.  Her recent sinus CT scan showed no significant acute or chronic sinusitis. 3.  Recurrent bilateral epistaxis, worse on the left side.  Multiple hypervascular areas are noted on the left anterior and superior nasal septum.  Plan: 1.  The physical exam findings and the CT images are reviewed with the patient. 2.  Endoscopic cauterization of the left nasal septum. 3.  Humidifier/nasal ointment during the winter months.  4.  Continue with her current allergy treatment regimen. 5.  In light of her persistent nasal obstruction, she will benefit from surgical intervention  with bilateral inferior turbinate reduction and endoscopic excision of the right concha bullosa.  The risk, benefits, and details of the procedures are reviewed with the patient.  Questions are invited and answered. 6.  The patient would like to proceed with the procedures.

## 2023-08-13 ENCOUNTER — Ambulatory Visit
Admission: RE | Admit: 2023-08-13 | Discharge: 2023-08-13 | Disposition: A | Discharge status: Home / Self Care | Source: Ambulatory Visit | Attending: Family Medicine | Admitting: Family Medicine

## 2023-08-13 VITALS — BP 150/98 | HR 74 | Temp 98.4°F | Resp 18 | Ht 67.0 in | Wt 180.0 lb

## 2023-08-13 DIAGNOSIS — J069 Acute upper respiratory infection, unspecified: Secondary | ICD-10-CM | POA: Diagnosis not present

## 2023-08-13 DIAGNOSIS — Z8709 Personal history of other diseases of the respiratory system: Secondary | ICD-10-CM

## 2023-08-13 LAB — POC SARS CORONAVIRUS 2 AG -  ED: SARS Coronavirus 2 Ag: NEGATIVE

## 2023-08-13 MED ORDER — AMOXICILLIN-POT CLAVULANATE 875-125 MG PO TABS: ORAL_TABLET | ORAL | 0 refills | Status: DC

## 2023-08-13 MED ORDER — PREDNISONE 20 MG PO TABS: ORAL_TABLET | ORAL | 0 refills | Status: DC

## 2023-08-13 NOTE — ED Triage Notes (Signed)
 Patient c/o cough, congestion, fatigue, nasal drainage x 1 day.  Patient has taken Nyquil.

## 2023-08-13 NOTE — Discharge Instructions (Signed)
 Take plain guaifenesin (1200mg  extended release tabs such as Mucinex) twice daily, with plenty of water, for cough and congestion.   Continue Singulair, Flonase, and Astelin.  Stop cetirizine for now.  Stop Nyquil. May take Delsym Cough Suppressant ("12 Hour Cough Relief") at bedtime for nighttime cough.  Try warm salt water gargles for sore throat.

## 2023-08-13 NOTE — ED Provider Notes (Signed)
 [facility]    CSN: [id number] Arrival date & time: 08/13/23  1102      History   Chief Complaint Chief Complaint  Patient presents with   Cough    I started coughing Saturday during the day. Later in the afternoon, I started feeling really tired and with sore throat. I have congestion as well. - Entered by patient    HPI Cindy Pittman is a 48 y.o. female.   Yesterday evening patient suddenly developed a mild cough, fatigue, and low grade fever.  Today she has had increasing sinus congestion.  Patient has a history of chronic recurring sinusitis, having recently undergone sinus surgery.  The history is provided by the patient.    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   Chronic rhinitis 08/08/2023   Epistaxis 08/08/2023   Chronic pansinusitis 06/29/2023   Hypertrophy of nasal turbinates 06/29/2023   Adjustment disorder 04/06/2023   Dermatitis 01/07/2022   Thyroid nodule 12/29/2020   Overweight 12/23/2019   Allergic rhinitis 12/11/2018   Dyslipidemia 10/12/2018   History of nephrolithiasis 10/12/2018   Exercise induced bronchospasm 06/30/2008   PCOS (polycystic ovarian syndrome) 07/18/2006    Past Surgical History:  Procedure Laterality Date   Labial Cystectomy     LUMBAR DISC SURGERY  1990   L4, Dr Gerlene Fee   Otic Tubes     SEPTOPLASTY     WISDOM TOOTH EXTRACTION      OB History   No obstetric history on file.      Home Medications    Prior to Admission medications   Medication Sig Start Date End Date Taking? Authorizing Provider  albuterol (VENTOLIN HFA) 108 (90 Base) MCG/ACT inhaler USE 1 TO 2 PUFFS EVERY 4 TO 6 HOURS AS NEEDED 06/26/23  Yes Ardith Dark, MD  amoxicillin-clavulanate (AUGMENTIN) 875-125 MG tablet Take one tab PO Q12hr with food 08/13/23   Yes Lattie Haw, MD  azelastine (ASTELIN) 0.1 % nasal spray Place 2 sprays into both nostrils 2 (two) times daily. 04/06/23  Yes Ardith Dark, MD  cetirizine (ZYRTEC) 10 MG tablet Take 10 mg by mouth daily. 04/16/23  Yes [provider]  Cholecalciferol (VITAMIN D-3) 25 MCG (1000 UT) CAPS Take by mouth.   Yes [provider]  fluticasone (FLONASE) 50 MCG/ACT nasal spray Place 1 spray into both nostrils daily. 04/06/23  Yes Ardith Dark, MD  ketoconazole (NIZORAL) 2 % cream Apply 1 Application topically 2 (two) times daily. 04/06/23  Yes Ardith Dark, MD  Levonorgestrel-Ethinyl Estradiol (AMETHIA) 0.1-0.02 & 0.01 MG tablet  08/25/18  Yes [provider]  montelukast (SINGULAIR) 10 MG tablet TAKE 1 TABLET BY MOUTH EVERYDAY AT BEDTIME 04/06/23  Yes Ardith Dark, MD  predniSONE (DELTASONE) 20 MG tablet Take one tab by mouth twice daily for 4 days, then one daily for 3 days. Take with food. 08/13/23  Yes Lattie Haw, MD  triamcinolone cream (KENALOG) 0.5 % Apply 1 Application topically 3 (three) times daily. 04/06/23  Yes Ardith Dark, MD  budesonide-formoterol [facility]) 80-4.5 MCG/ACT inhaler TAKE 2 PUFFS BY MOUTH TWICE A DAY 04/06/23   Ardith Dark, MD    Family History Family History  Problem Relation Age of Onset  Heart attack Mother 43       Smoker   Diabetes Mother    Breast cancer Mother    Diabetes Father    Gout Father    Heart attack Maternal Grandfather 76   Seizures Paternal Grandfather    Diabetes Cousin    Stroke Neg Hx     Social History Social History   Tobacco Use   Smoking status: Never   Smokeless tobacco: Never  Vaping Use   Vaping status: Never Used  Substance Use Topics   Alcohol use: Yes    Comment: Rarely   Drug use: No     Allergies   Sulfonamide derivatives   Review of Systems Review of Systems No sore throat + cough No pleuritic pain No wheezing + nasal congestion + post-nasal  drainage No sinus pain/pressure No itchy/red eyes No earache No hemoptysis No SOB + low grade fever No nausea No vomiting No abdominal pain No diarrhea No urinary symptoms No skin rash + fatigue No myalgias No headache Used OTC meds (Nyquil) without relief   Physical Exam Triage Vital Signs ED Triage Vitals  Encounter Vitals Group     BP 08/13/23 1113 (!) 150/98     Systolic BP Percentile --      Diastolic BP Percentile --      Pulse Rate 08/13/23 1113 74     Resp 08/13/23 1113 18     Temp 08/13/23 1113 98.4 F (36.9 C)     Temp Source 08/13/23 1113 Oral     SpO2 08/13/23 1113 93 %     Weight 08/13/23 1116 180 lb (81.6 kg)     Height 08/13/23 1116 5\' 7"  (1.702 m)     Head Circumference --      Peak Flow --      Pain Score 08/13/23 1116 0     Pain Loc --      Pain Education --      Exclude from Growth Chart --    No data found.  Updated Vital Signs BP (!) 150/98 (BP Location: Right Arm)   Pulse 74   Temp 98.4 F (36.9 C) (Oral)   Resp 18   Ht 5\' 7"  (1.702 m)   Wt 81.6 kg   SpO2 93%   BMI 28.19 kg/m   Visual Acuity Right Eye Distance:   Left Eye Distance:   Bilateral Distance:    Right Eye Near:   Left Eye Near:    Bilateral Near:     Physical Exam Nursing notes and Vital Signs reviewed. Appearance:  Patient appears stated age, and in no acute distress Eyes:  Pupils are equal, round, and reactive to light and accomodation.  Extraocular movement is intact.  Conjunctivae are not inflamed  Ears:  Canals normal.  Tympanic membranes normal.  Nose:  Congested turbinates.  No sinus tenderness.  Pharynx:  Normal Neck:  Supple.  Mildly enlarged lateral nodes are present, tender to palpation on the left.   Lungs:  Clear to auscultation.  Breath sounds are equal.  Moving air well. Heart:  Regular rate and rhythm without murmurs, rubs, or gallops.  Abdomen:  Nontender without masses or hepatosplenomegaly.  Bowel sounds are present.  No CVA or flank  tenderness.  Extremities:  No edema.  Skin:  No rash present.   UC Treatments / Results  Labs (all labs ordered are listed, but only abnormal results are displayed) Labs Reviewed  POC SARS CORONAVIRUS 2 AG -  ED negative  EKG   Radiology No results found.  Procedures Procedures (including critical care time)  Medications Ordered in UC Medications - No data to display  Initial Impression / Assessment and Plan / UC Course  I have reviewed the triage vital signs and the nursing notes.  Pertinent labs & imaging results that were available during my care of the patient were reviewed by me and considered in my medical decision making (see chart for details).    Because of patient's long history of recurring sinusitis, recent sinus surgery, and new onset URI, will begin Augmentin 875 BID for one week, and prednisone burst/taper. Followup with Family Doctor if not improved in one week.   Final Clinical Impressions(s) / UC Diagnoses   Final diagnoses:  Viral URI with cough  History of sinusitis     Discharge Instructions      Take plain guaifenesin (1200mg  extended release tabs such as Mucinex) twice daily, with plenty of water, for cough and congestion.   Continue Singulair, Flonase, and Astelin.  Stop cetirizine for now.  Stop Nyquil. May take Delsym Cough Suppressant ("12 Hour Cough Relief") at bedtime for nighttime cough.  Try warm salt water gargles for sore throat.       ED Prescriptions     Medication Sig Dispense Auth. Provider   amoxicillin-clavulanate (AUGMENTIN) 875-125 MG tablet Take one tab PO Q12hr with food 14 tablet Lattie Haw, MD   predniSONE (DELTASONE) 20 MG tablet Take one tab by mouth twice daily for 4 days, then one daily for 3 days. Take with food. 11 tablet Lattie Haw, MD         Lattie Haw, MD 08/14/23 [phone]

## 2023-08-21 ENCOUNTER — Ambulatory Visit: Payer: BC Managed Care – PPO | Attending: Obstetrics and Gynecology

## 2023-08-21 DIAGNOSIS — M6281 Muscle weakness (generalized): Secondary | ICD-10-CM | POA: Insufficient documentation

## 2023-08-21 DIAGNOSIS — R293 Abnormal posture: Secondary | ICD-10-CM | POA: Diagnosis present

## 2023-08-21 DIAGNOSIS — R279 Unspecified lack of coordination: Secondary | ICD-10-CM | POA: Diagnosis present

## 2023-08-21 DIAGNOSIS — M62838 Other muscle spasm: Secondary | ICD-10-CM | POA: Diagnosis present

## 2023-08-21 NOTE — Therapy (Signed)
 OUTPATIENT PHYSICAL THERAPY FEMALE PELVIC EVALUATION   Patient Name: Cindy Pittman MRN: [id number] DOB:Apr 09, 1976, 48 y.o., female Today's Date: 08/21/2023  END OF SESSION:  PT End of Session - 08/21/23 1609     Visit Number 2    Date for PT Re-Evaluation 01/24/24    Authorization Type [employer]    PT Start Time 1615    PT Stop Time 1655    PT Time Calculation (min) 40 min    Activity Tolerance Patient tolerated treatment well    Behavior During Therapy WFL for tasks assessed/performed             Past Medical History:  Diagnosis Date   History of ear infections    Hyperlipidemia     Ideal LDL goal = < 70 based on NMR Lipoprofile   Past Surgical History:  Procedure Laterality Date   Labial Cystectomy     LUMBAR DISC SURGERY  1990   L4, Dr Gerlene Fee   Otic Tubes     SEPTOPLASTY     WISDOM TOOTH EXTRACTION     Patient Active Problem List   Diagnosis Date Noted   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   Chronic rhinitis 08/08/2023   Epistaxis 08/08/2023   Chronic pansinusitis 06/29/2023   Hypertrophy of nasal turbinates 06/29/2023   Adjustment disorder 04/06/2023   Dermatitis 01/07/2022   Thyroid nodule 12/29/2020   Overweight 12/23/2019   Allergic rhinitis 12/11/2018   Dyslipidemia 10/12/2018   History of nephrolithiasis 10/12/2018   Exercise induced bronchospasm 06/30/2008   PCOS (polycystic ovarian syndrome) 07/18/2006    PCP: Ardith Dark, MD  REFERRING PROVIDER: Ardith Dark, MD Zelphia Cairo, MD   REFERRING DIAG: N39.3 (ICD-10-CM) - Stress incontinence (female) (female)  THERAPY DIAG:  Muscle weakness (generalized)  Unspecified lack of coordination  Abnormal posture  Other muscle spasm  Rationale for Evaluation and Treatment: Rehabilitation  ONSET DATE: 1 year  SUBJECTIVE:                                                                                                                                                                                            SUBJECTIVE STATEMENT: Pt states that she was sick over the last week so she has not performed a lot of exercises over the last week. She did have a few leaks with coughing, but has been working hard on not just crossing legs but contracting pelvic floor with coughs.   PAIN:  Are you having pain? Yes NPRS scale: 0/10 Pain location:  Lt hip (anterior/posterior)  Pain type: aching Pain description: intermittent   Aggravating factors: sitting all day, not being as  active Relieving factors: being active   PRECAUTIONS: None  RED FLAGS: None   WEIGHT BEARING RESTRICTIONS: No  FALLS:  Has patient fallen in last 6 months? No  OCCUPATION: high school counselor   ACTIVITY LEVEL : tennis, walking, running   PLOF: Independent  PATIENT GOALS: get back to running and playing tennis without leaking  PERTINENT HISTORY:  Labial cystectomy, lumbar disc surgery, PCOS, endometriosis, abdominal laparoscopy years ago for endo   BOWEL MOVEMENT: Pain with bowel movement: No Type of bowel movement:Frequency 2x/day and Strain no Fully empty rectum: Yes:   Leakage: No Pads: No Fiber supplement/laxative No  URINATION: Pain with urination: No Fully empty bladder: Yes: - Stream: Strong Urgency: Yes  Frequency: every hour when drinking a lot of water, currently every 3-4 hours; no usual nocturia  Leakage: Urge to void, Walking to the bathroom, Coughing, Sneezing, Laughing, and jumping Pads: No  INTERCOURSE:  Not sexually active, but has been painful in the past  PREGNANCY: NA  PROLAPSE: None   OBJECTIVE:  Note: Objective measures were completed at Evaluation unless otherwise noted. 08/09/23:   COGNITION: Overall cognitive status: Within functional limits for tasks assessed     SENSATION: Light touch: Appears intact  FUNCTIONAL TESTS:  Squat: Lt LE ER, tightness on LT side and small weight shift to the Lt Single leg stance:  Rt: Lt pelvic drop  Lt: Rt pelvic  drop Curl-up test: upper abdominal distortion    GAIT: Assistive device utilized: None Comments: WNL  POSTURE: rounded shoulders, forward head, increased thoracic kyphosis, and Rt posterior rotation, Lt iliac crest elevation, increased muscle tone in Rt lumbar/Lt thoracic paraspinals   LUMBARAROM/PROM:  A/PROM A/PROM  Eval (% available)  Flexion 75  Extension 50  Right lateral flexion 100  Left lateral flexion 75  Right rotation 75  Left rotation 50   (Blank rows = not tested)  PALPATION:  Pelvic Alignment: Rt posterior/Lt anterior rotation   Abdominal: Lt lower quadrant tenderness; lower abdominal restriction                External Perineal Exam: WNL                             Internal Pelvic Floor: tenderness and restriction throughout Lt deep pelvic floor muscles  Patient confirms identification and approves PT to assess internal pelvic floor and treatment Yes  PELVIC MMT:   MMT eval  Vaginal 2/5, 3 second hold, 8 repeat contractions  Diastasis Recti WNL, but distortion  (Blank rows = not tested)        TONE: High Lt levator ani  PROLAPSE: WNL  TODAY'S TREATMENT:                                                                                                                              DATE:  08/21/23 Neuromuscular re-education: Transversus abdominus training with multimodal cues for improved  motor control and breath coordination Bil supine UE ball press with transversus abdominus and pelvic floor muscle contractions and breath coordination 10x Supine hip adduction ball press with transversus abdominus and pelvic floor muscle contractions and breath coordination 10x Unilateral side lying UE ball press with transversus abdominus and pelvic floor muscle contractions and breath coordination 10x Unilateral UE ball press with transversus abdominus and pelvic floor muscle contraction 10x bil Seated hip adduction ball press with transversus abdominus and pelvic  floor muscle 2 x 10 Seated hip abduction red band with transversus abdominus and pelvic floor muscle 2 x 10 Seated resisted march red band with transversus abdominus and pelvic floor muscle 2 x 10  08/09/23  EVAL  Neuromuscular re-education: Pt provides verbal consent for internal vaginal/rectal pelvic floor exam. Internal vaginal pelvic floor muscle contraction training Long holds Quick flicks The knack Urge drill  PATIENT EDUCATION:  Education details: See above Person educated: Patient Education method: Explanation, Demonstration, Tactile cues, Verbal cues, and Handouts Education comprehension: verbalized understanding  HOME EXERCISE PROGRAM: [id number]  ASSESSMENT:  CLINICAL IMPRESSION: Pt has not been able to work on much of her initial HEP due to being very sick. She was able to begin core training and incorporate with pelvic floor muscle and breathing today with very good coordination. She progressed through variation of motor control exercises well and HEP updated. Believe she will be prepared to begin working on more challenging exercises on the table and in standing next session. She will continue to benefit from skilled PT intervention in order to decrease Lt hip pain, improve urinary incontinence, return to running/tennis, and begin/progress functional strengthening program.   OBJECTIVE IMPAIRMENTS: decreased activity tolerance, decreased coordination, decreased endurance, decreased mobility, decreased strength, increased fascial restrictions, increased muscle spasms, impaired tone, postural dysfunction, and pain.   ACTIVITY LIMITATIONS: sitting, continence, and running/jumping  PARTICIPATION LIMITATIONS: community activity and exercise (tennis)  PERSONAL FACTORS: 3+ comorbidities: medical history  are also affecting patient's functional outcome.   REHAB POTENTIAL: Good  CLINICAL DECISION MAKING: Stable/uncomplicated  EVALUATION COMPLEXITY: Low   GOALS: Goals  reviewed with patient? Yes  SHORT TERM GOALS: Target date: 09/06/2023   Pt will be independent with HEP.   Baseline: Goal status: INITIAL  2.  Pt will be independent with the knack, urge suppression technique, and double voiding in order to improve bladder habits and decrease urinary incontinence.   Baseline:  Goal status: INITIAL  3.  Pt will report 25% improvement in urinary incontinence and urgency.  Baseline:  Goal status: INITIAL  4.  Pt will increase pelvic floor muscle strength to 3/5.  Baseline:  Goal status: INITIAL   LONG TERM GOALS: Target date: 01/24/24  Pt will be independent with advanced HEP.   Baseline:  Goal status: INITIAL  2.  Pt will report 755 improvement in urinary incontinence. Baseline:  Goal status: INITIAL  3.  Pt will be able to go 2-3 hours in between voids without urgency or incontinence in order to improve QOL and perform all functional activities with less difficulty.   Baseline:  Goal status: INITIAL  4.  Pt will be able to run, jump, and return to playing tennis without concerning issues with urinary incontinence.  Baseline:  Goal status: INITIAL  5.  Pt will report no leaks with laughing, coughing, sneezing in order to improve comfort with interpersonal relationships and community activities.   Baseline:  Goal status: INITIAL  6.  Pt will improve pelvic floor muscle strength to 4/5.  Baseline:  Goal status: INITIAL  PLAN:  PT FREQUENCY: 1-2x/week  PT DURATION: 6 months  PLANNED INTERVENTIONS: 97110-Therapeutic exercises, 97530- Therapeutic activity, 97112- Neuromuscular re-education, 97535- Self Care, 10272- Manual therapy, Dry Needling, and Biofeedback  PLAN FOR NEXT SESSION: Progress core strengthening program.    Julio Alm, PT, DPT03/10/254:57 PM

## 2023-08-22 ENCOUNTER — Encounter: Payer: Self-pay | Admitting: Family Medicine

## 2023-08-22 ENCOUNTER — Telehealth (INDEPENDENT_AMBULATORY_CARE_PROVIDER_SITE_OTHER): Admitting: Family Medicine

## 2023-08-22 VITALS — Ht 67.0 in | Wt 180.0 lb

## 2023-08-22 DIAGNOSIS — J45990 Exercise induced bronchospasm: Secondary | ICD-10-CM

## 2023-08-22 DIAGNOSIS — J309 Allergic rhinitis, unspecified: Secondary | ICD-10-CM | POA: Diagnosis not present

## 2023-08-22 DIAGNOSIS — R059 Cough, unspecified: Secondary | ICD-10-CM | POA: Diagnosis not present

## 2023-08-22 MED ORDER — BUDESONIDE-FORMOTEROL FUMARATE 80-4.5 MCG/ACT IN AERO: INHALATION_SPRAY | RESPIRATORY_TRACT | 1 refills | Status: DC

## 2023-08-22 MED ORDER — BENZONATATE 200 MG PO CAPS: 200.0000 mg | ORAL_CAPSULE | Freq: Two times a day (BID) | ORAL | 0 refills | Status: DC | PRN

## 2023-08-22 NOTE — Assessment & Plan Note (Signed)
 Mild flareup recently likely related to her recent URI as well as flareup of her allergies.  She is currently on Singulair, Zyrtec and uses albuterol as needed.  We will restart her Symbicort as she has not been taking this consistently.  Hopefully this will help with her cough as well.  She will let us know if not proving in the next 1 to 2 weeks as above.

## 2023-08-22 NOTE — Assessment & Plan Note (Signed)
 Flareup recently.  She will continue her Flonase, Astelin, Singulair, and Zyrtec.  She is also getting allergy shots through the allergist.

## 2023-08-22 NOTE — Progress Notes (Signed)
   Cindy Pittman is a 48 y.o. female who presents today for a virtual office visit.  Assessment/Plan:  New/Acute Problems: Cough Discussed limitations of virtual visit and inability perform physical exam.  Appears well today on virtual exam.  Her cough is likely multifactorial in setting of postinfectious cough as well as flareup of her seasonal allergies.  Will be treating her known reactive airway disease as below.  Will also send in Tessalon.  Dissipate that this will continue to improve over the next 1 to 2 weeks.  She will let us know if she has any changes symptoms or if symptoms fail to improve and we can check x-ray at that time.  Chronic Problems Addressed Today: Exercise induced bronchospasm Mild flareup recently likely related to her recent URI as well as flareup of her allergies.  She is currently on Singulair, Zyrtec and uses albuterol as needed.  We will restart her Symbicort as she has not been taking this consistently.  Hopefully this will help with her cough as well.  She will let us know if not proving in the next 1 to 2 weeks as above.  Allergic rhinitis Flareup recently.  She will continue her Flonase, Astelin, Singulair, and Zyrtec.  She is also getting allergy shots through the allergist.     Subjective:  HPI:  See A/P for status of chronic conditions.  Patient here today with persistent cough.  She went to urgent care 9 days ago with nasal congestion and cough.  COVID test at that time was negative.  She was diagnosed with sinusitis and started on Augmentin and prednisone.  Her symptoms have slowly improved.  Her nasal congestion and fatigue have improved.  She has not had any fevers.  Her primary concern today is persistent cough.  This does not change significantly.  She does feel overall a bit better today than she has for the last week or so.  She has tried over-the-counter meds without much improvement.  She is also using her home albuterol inhaler with modest  improvement.  She has had a little bit worsening allergic rhinitis.  She is additionally getting work done on her house due to a water leak from last year.        Objective/Observations  Physical Exam: Gen: NAD, resting comfortably Pulm: Normal work of breathing Neuro: Grossly normal, moves all extremities Psych: Normal affect and thought content  Virtual Visit via Video   I connected with Cindy Pittman on 08/22/23 at 11:20 AM EDT by a video enabled telemedicine application and verified that I am speaking with the correct person using two identifiers. The limitations of evaluation and management by telemedicine and the availability of in person appointments were discussed. The patient expressed understanding and agreed to proceed.   Patient location: Home Provider location: [place] [employer] Persons participating in the virtual visit: Myself and Patient     Katina Degree. Jimmey Ralph, MD 08/22/2023 12:09 PM

## 2023-08-28 ENCOUNTER — Ambulatory Visit: Payer: BC Managed Care – PPO

## 2023-08-28 DIAGNOSIS — M6281 Muscle weakness (generalized): Secondary | ICD-10-CM | POA: Diagnosis not present

## 2023-08-28 DIAGNOSIS — R279 Unspecified lack of coordination: Secondary | ICD-10-CM

## 2023-08-28 DIAGNOSIS — R293 Abnormal posture: Secondary | ICD-10-CM

## 2023-08-28 DIAGNOSIS — M62838 Other muscle spasm: Secondary | ICD-10-CM

## 2023-08-28 NOTE — Therapy (Signed)
 OUTPATIENT PHYSICAL THERAPY FEMALE PELVIC TREATMENT   Patient Name: Stefan Karen MRN: [id number] DOB:1975-08-31, 48 y.o., female Today's Date: 08/28/2023  END OF SESSION:  PT End of Session - 08/28/23 1622     Visit Number 3    Date for PT Re-Evaluation 01/24/24    Authorization Type [employer]    PT Start Time 1615    PT Stop Time 1655    PT Time Calculation (min) 40 min    Activity Tolerance Patient tolerated treatment well    Behavior During Therapy WFL for tasks assessed/performed             Past Medical History:  Diagnosis Date   History of ear infections    Hyperlipidemia     Ideal LDL goal = < 70 based on NMR Lipoprofile   Past Surgical History:  Procedure Laterality Date   Labial Cystectomy     LUMBAR DISC SURGERY  1990   L4, Dr Gerlene Fee   Otic Tubes     SEPTOPLASTY     WISDOM TOOTH EXTRACTION     Patient Active Problem List   Diagnosis Date Noted   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   Chronic rhinitis 08/08/2023   Epistaxis 08/08/2023   Chronic pansinusitis 06/29/2023   Hypertrophy of nasal turbinates 06/29/2023   Adjustment disorder 04/06/2023   Dermatitis 01/07/2022   Thyroid nodule 12/29/2020   Overweight 12/23/2019   Allergic rhinitis 12/11/2018   Dyslipidemia 10/12/2018   History of nephrolithiasis 10/12/2018   Exercise induced bronchospasm 06/30/2008   PCOS (polycystic ovarian syndrome) 07/18/2006    PCP: Ardith Dark, MD  REFERRING PROVIDER: Ardith Dark, MD Zelphia Cairo, MD   REFERRING DIAG: N39.3 (ICD-10-CM) - Stress incontinence (female) (female)  THERAPY DIAG:  Muscle weakness (generalized)  Unspecified lack of coordination  Abnormal posture  Other muscle spasm  Rationale for Evaluation and Treatment: Rehabilitation  ONSET DATE: 1 year  SUBJECTIVE:                                                                                                                                                                                            SUBJECTIVE STATEMENT: Pt states that she has had no leaking and is starting to feel more confident with laughing, coughing, sneezing.   PAIN:  Are you having pain? Yes NPRS scale: 0/10 Pain location:  Lt hip (anterior/posterior)  Pain type: aching Pain description: intermittent   Aggravating factors: sitting all day, not being as active Relieving factors: being active   PRECAUTIONS: None  RED FLAGS: None   WEIGHT BEARING RESTRICTIONS: No  FALLS:  Has patient fallen in last 6  months? No  OCCUPATION: high school counselor   ACTIVITY LEVEL : tennis, walking, running   PLOF: Independent  PATIENT GOALS: get back to running and playing tennis without leaking  PERTINENT HISTORY:  Labial cystectomy, lumbar disc surgery, PCOS, endometriosis, abdominal laparoscopy years ago for endo   BOWEL MOVEMENT: Pain with bowel movement: No Type of bowel movement:Frequency 2x/day and Strain no Fully empty rectum: Yes:   Leakage: No Pads: No Fiber supplement/laxative No  URINATION: Pain with urination: No Fully empty bladder: Yes: - Stream: Strong Urgency: Yes  Frequency: every hour when drinking a lot of water, currently every 3-4 hours; no usual nocturia  Leakage: Urge to void, Walking to the bathroom, Coughing, Sneezing, Laughing, and jumping Pads: No  INTERCOURSE:  Not sexually active, but has been painful in the past  PREGNANCY: NA  PROLAPSE: None   OBJECTIVE:  Note: Objective measures were completed at Evaluation unless otherwise noted. 08/09/23:   COGNITION: Overall cognitive status: Within functional limits for tasks assessed     SENSATION: Light touch: Appears intact  FUNCTIONAL TESTS:  Squat: Lt LE ER, tightness on LT side and small weight shift to the Lt Single leg stance:  Rt: Lt pelvic drop  Lt: Rt pelvic drop Curl-up test: upper abdominal distortion    GAIT: Assistive device utilized: None Comments: WNL  POSTURE: rounded  shoulders, forward head, increased thoracic kyphosis, and Rt posterior rotation, Lt iliac crest elevation, increased muscle tone in Rt lumbar/Lt thoracic paraspinals   LUMBARAROM/PROM:  A/PROM A/PROM  Eval (% available)  Flexion 75  Extension 50  Right lateral flexion 100  Left lateral flexion 75  Right rotation 75  Left rotation 50   (Blank rows = not tested)  PALPATION:  Pelvic Alignment: Rt posterior/Lt anterior rotation   Abdominal: Lt lower quadrant tenderness; lower abdominal restriction                External Perineal Exam: WNL                             Internal Pelvic Floor: tenderness and restriction throughout Lt deep pelvic floor muscles  Patient confirms identification and approves PT to assess internal pelvic floor and treatment Yes  PELVIC MMT:   MMT eval  Vaginal 2/5, 3 second hold, 8 repeat contractions  Diastasis Recti WNL, but distortion  (Blank rows = not tested)        TONE: High Lt levator ani  PROLAPSE: WNL  TODAY'S TREATMENT:                                                                                                                              DATE:  08/28/23 Neuromuscular re-education: Seated hip adduction ball press with transversus abdominus and pelvic floor muscle 2 x 10 Bridge with hip adduction, transversus abdominus, and pelvic floor muscle 2 x 10 Supine leg extensions 10x bil Table top 10  breaths Modified dead bug 10x bil Bear crawl plank 10x Exercises: Lower trunk rotation 2 x 10 Cat cow 2 x 10 Supine piriformis stretch 60 sec bil Child's pose 10 breaths   08/21/23 Neuromuscular re-education: Transversus abdominus training with multimodal cues for improved motor control and breath coordination Bil supine UE ball press with transversus abdominus and pelvic floor muscle contractions and breath coordination 10x Supine hip adduction ball press with transversus abdominus and pelvic floor muscle contractions and breath  coordination 10x Unilateral side lying UE ball press with transversus abdominus and pelvic floor muscle contractions and breath coordination 10x Unilateral UE ball press with transversus abdominus and pelvic floor muscle contraction 10x bil Seated hip adduction ball press with transversus abdominus and pelvic floor muscle 2 x 10 Seated hip abduction red band with transversus abdominus and pelvic floor muscle 2 x 10 Seated resisted march red band with transversus abdominus and pelvic floor muscle 2 x 10  08/09/23  EVAL  Neuromuscular re-education: Pt provides verbal consent for internal vaginal/rectal pelvic floor exam. Internal vaginal pelvic floor muscle contraction training Long holds Quick flicks The knack Urge drill  PATIENT EDUCATION:  Education details: See above Person educated: Patient Education method: Explanation, Demonstration, Tactile cues, Verbal cues, and Handouts Education comprehension: verbalized understanding  HOME EXERCISE PROGRAM: [id number]  ASSESSMENT:  CLINICAL IMPRESSION: Pt is doing very well with feeling improved bladder control. She was able to progress strengthening activities to provide increased core challenge with good coordination. We modified dead bug to leave one foot down due to appropriate challenge with just table top holds. She has good awareness of breath coordination and when not performing correctly. HEP updated. She will continue to benefit from skilled PT intervention in order to decrease Lt hip pain, improve urinary incontinence, return to running/tennis, and begin/progress functional strengthening program.   OBJECTIVE IMPAIRMENTS: decreased activity tolerance, decreased coordination, decreased endurance, decreased mobility, decreased strength, increased fascial restrictions, increased muscle spasms, impaired tone, postural dysfunction, and pain.   ACTIVITY LIMITATIONS: sitting, continence, and running/jumping  PARTICIPATION LIMITATIONS:  community activity and exercise (tennis)  PERSONAL FACTORS: 3+ comorbidities: medical history  are also affecting patient's functional outcome.   REHAB POTENTIAL: Good  CLINICAL DECISION MAKING: Stable/uncomplicated  EVALUATION COMPLEXITY: Low   GOALS: Goals reviewed with patient? Yes  SHORT TERM GOALS: Target date: 09/06/2023   Pt will be independent with HEP.   Baseline: Goal status: INITIAL  2.  Pt will be independent with the knack, urge suppression technique, and double voiding in order to improve bladder habits and decrease urinary incontinence.   Baseline:  Goal status: INITIAL  3.  Pt will report 25% improvement in urinary incontinence and urgency.  Baseline:  Goal status: INITIAL  4.  Pt will increase pelvic floor muscle strength to 3/5.  Baseline:  Goal status: INITIAL   LONG TERM GOALS: Target date: 01/24/24  Pt will be independent with advanced HEP.   Baseline:  Goal status: INITIAL  2.  Pt will report 755 improvement in urinary incontinence. Baseline:  Goal status: INITIAL  3.  Pt will be able to go 2-3 hours in between voids without urgency or incontinence in order to improve QOL and perform all functional activities with less difficulty.   Baseline:  Goal status: INITIAL  4.  Pt will be able to run, jump, and return to playing tennis without concerning issues with urinary incontinence.  Baseline:  Goal status: INITIAL  5.  Pt will report no leaks with laughing, coughing, sneezing  in order to improve comfort with interpersonal relationships and community activities.   Baseline:  Goal status: INITIAL  6.  Pt will improve pelvic floor muscle strength to 4/5.  Baseline:  Goal status: INITIAL  PLAN:  PT FREQUENCY: 1-2x/week  PT DURATION: 6 months  PLANNED INTERVENTIONS: 97110-Therapeutic exercises, 97530- Therapeutic activity, 97112- Neuromuscular re-education, 97535- Self Care, 14782- Manual therapy, Dry Needling, and Biofeedback  PLAN  FOR NEXT SESSION: Progress core strengthening program to include standing exercises.    Julio Alm, PT, DPT03/17/254:54 PM

## 2023-09-04 ENCOUNTER — Ambulatory Visit: Payer: BC Managed Care – PPO

## 2023-09-04 DIAGNOSIS — R293 Abnormal posture: Secondary | ICD-10-CM

## 2023-09-04 DIAGNOSIS — M62838 Other muscle spasm: Secondary | ICD-10-CM

## 2023-09-04 DIAGNOSIS — M6281 Muscle weakness (generalized): Secondary | ICD-10-CM | POA: Diagnosis not present

## 2023-09-04 DIAGNOSIS — R279 Unspecified lack of coordination: Secondary | ICD-10-CM

## 2023-09-04 NOTE — Therapy (Signed)
 OUTPATIENT PHYSICAL THERAPY FEMALE PELVIC TREATMENT   Patient Name: Cindy Pittman MRN: [id number] DOB:Nov 15, 1975, 48 y.o., female Today's Date: 09/04/2023  END OF SESSION:  PT End of Session - 09/04/23 1620     Visit Number 4    Date for PT Re-Evaluation 01/24/24    Authorization Type [employer]    PT Start Time 1615    PT Stop Time 1655    PT Time Calculation (min) 40 min    Activity Tolerance Patient tolerated treatment well    Behavior During Therapy WFL for tasks assessed/performed             Past Medical History:  Diagnosis Date   History of ear infections    Hyperlipidemia     Ideal LDL goal = < 70 based on NMR Lipoprofile   Past Surgical History:  Procedure Laterality Date   Labial Cystectomy     LUMBAR DISC SURGERY  1990   L4, Dr Gerlene Fee   Otic Tubes     SEPTOPLASTY     WISDOM TOOTH EXTRACTION     Patient Active Problem List   Diagnosis Date Noted   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   Chronic rhinitis 08/08/2023   Epistaxis 08/08/2023   Chronic pansinusitis 06/29/2023   Hypertrophy of nasal turbinates 06/29/2023   Adjustment disorder 04/06/2023   Dermatitis 01/07/2022   Thyroid nodule 12/29/2020   Overweight 12/23/2019   Allergic rhinitis 12/11/2018   Dyslipidemia 10/12/2018   History of nephrolithiasis 10/12/2018   Exercise induced bronchospasm 06/30/2008   PCOS (polycystic ovarian syndrome) 07/18/2006    PCP: Ardith Dark, MD  REFERRING PROVIDER: Ardith Dark, MD Zelphia Cairo, MD   REFERRING DIAG: N39.3 (ICD-10-CM) - Stress incontinence (female) (female)  THERAPY DIAG:  Muscle weakness (generalized)  Unspecified lack of coordination  Abnormal posture  Other muscle spasm  Rationale for Evaluation and Treatment: Rehabilitation  ONSET DATE: 1 year  SUBJECTIVE:                                                                                                                                                                                            SUBJECTIVE STATEMENT: Pt has run twice since her last visit and did not have any issues with leaking. She feels like bladder is getting better.  PAIN:  Are you having pain? Yes NPRS scale: 5/10 Pain location:  Lt hip (anterior/posterior)  Pain type: aching Pain description: intermittent   Aggravating factors: sitting all day, not being as active Relieving factors: being active   PRECAUTIONS: None  RED FLAGS: None   WEIGHT BEARING RESTRICTIONS: No  FALLS:  Has patient fallen  in last 6 months? No  OCCUPATION: high school counselor   ACTIVITY LEVEL : tennis, walking, running   PLOF: Independent  PATIENT GOALS: get back to running and playing tennis without leaking  PERTINENT HISTORY:  Labial cystectomy, lumbar disc surgery, PCOS, endometriosis, abdominal laparoscopy years ago for endo   BOWEL MOVEMENT: Pain with bowel movement: No Type of bowel movement:Frequency 2x/day and Strain no Fully empty rectum: Yes:   Leakage: No Pads: No Fiber supplement/laxative No  URINATION: Pain with urination: No Fully empty bladder: Yes: - Stream: Strong Urgency: Yes  Frequency: every hour when drinking a lot of water, currently every 3-4 hours; no usual nocturia  Leakage: Urge to void, Walking to the bathroom, Coughing, Sneezing, Laughing, and jumping Pads: No  INTERCOURSE:  Not sexually active, but has been painful in the past  PREGNANCY: NA  PROLAPSE: None   OBJECTIVE:  Note: Objective measures were completed at Evaluation unless otherwise noted. 08/09/23:   COGNITION: Overall cognitive status: Within functional limits for tasks assessed     SENSATION: Light touch: Appears intact  FUNCTIONAL TESTS:  Squat: Lt LE ER, tightness on LT side and small weight shift to the Lt Single leg stance:  Rt: Lt pelvic drop  Lt: Rt pelvic drop Curl-up test: upper abdominal distortion    GAIT: Assistive device utilized: None Comments: WNL  POSTURE: rounded  shoulders, forward head, increased thoracic kyphosis, and Rt posterior rotation, Lt iliac crest elevation, increased muscle tone in Rt lumbar/Lt thoracic paraspinals   LUMBARAROM/PROM:  A/PROM A/PROM  Eval (% available)  Flexion 75  Extension 50  Right lateral flexion 100  Left lateral flexion 75  Right rotation 75  Left rotation 50   (Blank rows = not tested)  PALPATION:  Pelvic Alignment: Rt posterior/Lt anterior rotation   Abdominal: Lt lower quadrant tenderness; lower abdominal restriction                External Perineal Exam: WNL                             Internal Pelvic Floor: tenderness and restriction throughout Lt deep pelvic floor muscles  Patient confirms identification and approves PT to assess internal pelvic floor and treatment Yes  PELVIC MMT:   MMT eval  Vaginal 2/5, 3 second hold, 8 repeat contractions  Diastasis Recti WNL, but distortion  (Blank rows = not tested)        TONE: High Lt levator ani  PROLAPSE: WNL  TODAY'S TREATMENT:                                                                                                                              DATE:  3/24825 Manual: Trigger Point Dry Needling  Initial Treatment: Pt instructed on Dry Needling rational, procedures, and possible side effects. Pt instructed to expect mild to moderate muscle soreness later in the day and/or into the  next day.  Pt instructed in methods to reduce muscle soreness. Pt instructed to continue prescribed HEP. Patient was educated on signs and symptoms of infection and other risk factors and advised to seek medical attention should they occur.  Patient verbalized understanding of these instructions and education.   Patient Verbal Consent Given: Yes Education Handout Provided: Yes Muscles Treated: Lt glutes Electrical Stimulation Performed: No Treatment Response/Outcome: twitch response and release  Soft tissue mobilization to Lt glutes  Neuromuscular  re-education: Standing chop 2 x 10 bil 5 lbs Pallof press 2 x 10 bil red band  Therapeutic activities: Squats 2 x 10 3 way kick 10x each, bil Heels slams 2 x 10   08/28/23 Neuromuscular re-education: Seated hip adduction ball press with transversus abdominus and pelvic floor muscle 2 x 10 Bridge with hip adduction, transversus abdominus, and pelvic floor muscle 2 x 10 Supine leg extensions 10x bil Table top 10 breaths Modified dead bug 10x bil Bear crawl plank 10x Exercises: Lower trunk rotation 2 x 10 Cat cow 2 x 10 Supine piriformis stretch 60 sec bil Child's pose 10 breaths   08/21/23 Neuromuscular re-education: Transversus abdominus training with multimodal cues for improved motor control and breath coordination Bil supine UE ball press with transversus abdominus and pelvic floor muscle contractions and breath coordination 10x Supine hip adduction ball press with transversus abdominus and pelvic floor muscle contractions and breath coordination 10x Unilateral side lying UE ball press with transversus abdominus and pelvic floor muscle contractions and breath coordination 10x Unilateral UE ball press with transversus abdominus and pelvic floor muscle contraction 10x bil Seated hip adduction ball press with transversus abdominus and pelvic floor muscle 2 x 10 Seated hip abduction red band with transversus abdominus and pelvic floor muscle 2 x 10 Seated resisted march red band with transversus abdominus and pelvic floor muscle 2 x 10    PATIENT EDUCATION:  Education details: See above Person educated: Patient Education method: Programmer, multimedia, Demonstration, Actor cues, Verbal cues, and Handouts Education comprehension: verbalized understanding  HOME EXERCISE PROGRAM: [id number]  ASSESSMENT:  CLINICAL IMPRESSION: Pt is doing very well with being able to run without leaking. We discussed trying to play tennis in the net week to test out bladder control to see if she has made  progress. Due to Lt hip pain flaring up with running, performed palpation of Lt hip and found to have notable trigger points; dry needling performed with excellent tolerance and reduction in restriction followed by soft tissue mobilization to further reduce tension. She did well with all standing exercise progressions and we started working on gentle impact with heel slams. She denied any symptoms going through new exercises. HEP updated. She will continue to benefit from skilled PT intervention in order to decrease Lt hip pain, improve urinary incontinence, return to running/tennis, and begin/progress functional strengthening program.   OBJECTIVE IMPAIRMENTS: decreased activity tolerance, decreased coordination, decreased endurance, decreased mobility, decreased strength, increased fascial restrictions, increased muscle spasms, impaired tone, postural dysfunction, and pain.   ACTIVITY LIMITATIONS: sitting, continence, and running/jumping  PARTICIPATION LIMITATIONS: community activity and exercise (tennis)  PERSONAL FACTORS: 3+ comorbidities: medical history  are also affecting patient's functional outcome.   REHAB POTENTIAL: Good  CLINICAL DECISION MAKING: Stable/uncomplicated  EVALUATION COMPLEXITY: Low   GOALS: Goals reviewed with patient? Yes  SHORT TERM GOALS: Target date: 09/06/2023   Pt will be independent with HEP.   Baseline: Goal status: INITIAL  2.  Pt will be independent with the knack, urge suppression technique,  and double voiding in order to improve bladder habits and decrease urinary incontinence.   Baseline:  Goal status: INITIAL  3.  Pt will report 25% improvement in urinary incontinence and urgency.  Baseline:  Goal status: INITIAL  4.  Pt will increase pelvic floor muscle strength to 3/5.  Baseline:  Goal status: INITIAL   LONG TERM GOALS: Target date: 01/24/24  Pt will be independent with advanced HEP.   Baseline:  Goal status: INITIAL  2.  Pt will  report 755 improvement in urinary incontinence. Baseline:  Goal status: INITIAL  3.  Pt will be able to go 2-3 hours in between voids without urgency or incontinence in order to improve QOL and perform all functional activities with less difficulty.   Baseline:  Goal status: INITIAL  4.  Pt will be able to run, jump, and return to playing tennis without concerning issues with urinary incontinence.  Baseline:  Goal status: INITIAL  5.  Pt will report no leaks with laughing, coughing, sneezing in order to improve comfort with interpersonal relationships and community activities.   Baseline:  Goal status: INITIAL  6.  Pt will improve pelvic floor muscle strength to 4/5.  Baseline:  Goal status: INITIAL  PLAN:  PT FREQUENCY: 1-2x/week  PT DURATION: 6 months  PLANNED INTERVENTIONS: 97110-Therapeutic exercises, 97530- Therapeutic activity, 97112- Neuromuscular re-education, 97535- Self Care, 16109- Manual therapy, Dry Needling, and Biofeedback  PLAN FOR NEXT SESSION: Progress core strengthening program to include standing exercises.    Julio Alm, PT, DPT03/24/254:59 PM

## 2023-09-11 ENCOUNTER — Ambulatory Visit: Payer: Self-pay

## 2023-09-11 DIAGNOSIS — M6281 Muscle weakness (generalized): Secondary | ICD-10-CM | POA: Diagnosis not present

## 2023-09-11 DIAGNOSIS — R293 Abnormal posture: Secondary | ICD-10-CM

## 2023-09-11 DIAGNOSIS — R279 Unspecified lack of coordination: Secondary | ICD-10-CM

## 2023-09-11 DIAGNOSIS — M62838 Other muscle spasm: Secondary | ICD-10-CM

## 2023-09-11 NOTE — Therapy (Signed)
 OUTPATIENT PHYSICAL THERAPY FEMALE PELVIC TREATMENT   Patient Name: Cindy Pittman MRN: [id number] DOB:Nov 05, 1975, 48 y.o., female Today's Date: 09/11/2023  END OF SESSION:  PT End of Session - 09/11/23 0846     Visit Number 5    Date for PT Re-Evaluation 01/24/24    Authorization Type [employer]    PT Start Time 0845    PT Stop Time 0925    PT Time Calculation (min) 40 min    Activity Tolerance Patient tolerated treatment well    Behavior During Therapy WFL for tasks assessed/performed             Past Medical History:  Diagnosis Date   History of ear infections    Hyperlipidemia     Ideal LDL goal = < 70 based on NMR Lipoprofile   Past Surgical History:  Procedure Laterality Date   Labial Cystectomy     LUMBAR DISC SURGERY  1990   L4, Dr Gerlene Fee   Otic Tubes     SEPTOPLASTY     WISDOM TOOTH EXTRACTION     Patient Active Problem List   Diagnosis Date Noted   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   Chronic rhinitis 08/08/2023   Epistaxis 08/08/2023   Chronic pansinusitis 06/29/2023   Hypertrophy of nasal turbinates 06/29/2023   Adjustment disorder 04/06/2023   Dermatitis 01/07/2022   Thyroid nodule 12/29/2020   Overweight 12/23/2019   Allergic rhinitis 12/11/2018   Dyslipidemia 10/12/2018   History of nephrolithiasis 10/12/2018   Exercise induced bronchospasm 06/30/2008   PCOS (polycystic ovarian syndrome) 07/18/2006    PCP: Ardith Dark, MD  REFERRING PROVIDER: Ardith Dark, MD Zelphia Cairo, MD   REFERRING DIAG: N39.3 (ICD-10-CM) - Stress incontinence (female) (female)  THERAPY DIAG:  Muscle weakness (generalized)  Unspecified lack of coordination  Abnormal posture  Other muscle spasm  Rationale for Evaluation and Treatment: Rehabilitation  ONSET DATE: 1 year  SUBJECTIVE:                                                                                                                                                                                            SUBJECTIVE STATEMENT: Pt states that she was a little sore after last session. The pain is not completely gone, but it is less. She did not play tennis due to pollen, but she did run a mile and went  PAIN: 09/11/23 Are you having pain? Yes NPRS scale: 3-4/10 Pain location:  Lt hip (anterior/posterior)  Pain type: aching Pain description: intermittent   Aggravating factors: sitting all day, not being as active Relieving factors: being active   PRECAUTIONS: None  RED  FLAGS: None   WEIGHT BEARING RESTRICTIONS: No  FALLS:  Has patient fallen in last 6 months? No  OCCUPATION: high school counselor   ACTIVITY LEVEL : tennis, walking, running   PLOF: Independent  PATIENT GOALS: get back to running and playing tennis without leaking  PERTINENT HISTORY:  Labial cystectomy, lumbar disc surgery, PCOS, endometriosis, abdominal laparoscopy years ago for endo   BOWEL MOVEMENT: Pain with bowel movement: No Type of bowel movement:Frequency 2x/day and Strain no Fully empty rectum: Yes:   Leakage: No Pads: No Fiber supplement/laxative No  URINATION: Pain with urination: No Fully empty bladder: Yes: - Stream: Strong Urgency: Yes  Frequency: every hour when drinking a lot of water, currently every 3-4 hours; no usual nocturia  Leakage: Urge to void, Walking to the bathroom, Coughing, Sneezing, Laughing, and jumping Pads: No  INTERCOURSE:  Not sexually active, but has been painful in the past  PREGNANCY: NA  PROLAPSE: None   OBJECTIVE:  Note: Objective measures were completed at Evaluation unless otherwise noted. 08/09/23:   COGNITION: Overall cognitive status: Within functional limits for tasks assessed     SENSATION: Light touch: Appears intact  FUNCTIONAL TESTS:  Squat: Lt LE ER, tightness on LT side and small weight shift to the Lt Single leg stance:  Rt: Lt pelvic drop  Lt: Rt pelvic drop Curl-up test: upper abdominal distortion     GAIT: Assistive device utilized: None Comments: WNL  POSTURE: rounded shoulders, forward head, increased thoracic kyphosis, and Rt posterior rotation, Lt iliac crest elevation, increased muscle tone in Rt lumbar/Lt thoracic paraspinals   LUMBARAROM/PROM:  A/PROM A/PROM  Eval (% available)  Flexion 75  Extension 50  Right lateral flexion 100  Left lateral flexion 75  Right rotation 75  Left rotation 50   (Blank rows = not tested)  PALPATION:  Pelvic Alignment: Rt posterior/Lt anterior rotation   Abdominal: Lt lower quadrant tenderness; lower abdominal restriction                External Perineal Exam: WNL                             Internal Pelvic Floor: tenderness and restriction throughout Lt deep pelvic floor muscles  Patient confirms identification and approves PT to assess internal pelvic floor and treatment Yes  PELVIC MMT:   MMT eval  Vaginal 2/5, 3 second hold, 8 repeat contractions  Diastasis Recti WNL, but distortion  (Blank rows = not tested)        TONE: High Lt levator ani  PROLAPSE: WNL  TODAY'S TREATMENT:                                                                                                                              DATE:  09/11/23 Manual: Trigger Point Dry Needling  Initial Treatment: Pt instructed on Dry Needling rational, procedures, and possible side effects. Pt instructed  to expect mild to moderate muscle soreness later in the day and/or into the next day.  Pt instructed in methods to reduce muscle soreness. Pt instructed to continue prescribed HEP. Patient was educated on signs and symptoms of infection and other risk factors and advised to seek medical attention should they occur.  Patient verbalized understanding of these instructions and education.   Patient Verbal Consent Given: Yes Education Handout Provided: Yes Muscles Treated: Lt glutes Electrical Stimulation Performed: No Treatment Response/Outcome: twitch  response and release  Soft tissue mobilization to Lt glutes and lumbar paraspinals Scar tissue mobilization to low back Neuromuscular re-education: Modified side plank with clam shell 2 x 5 Chop 10x bil 7lbs Lift 10x bil 3lbs Exercises: Seated lateral side body stretch 15 breaths bil Seated piriformis stretch 60 sec  Seated side body stretch in "P" 10 breaths bil  3/24825 Manual: Trigger Point Dry Needling  Initial Treatment: Pt instructed on Dry Needling rational, procedures, and possible side effects. Pt instructed to expect mild to moderate muscle soreness later in the day and/or into the next day.  Pt instructed in methods to reduce muscle soreness. Pt instructed to continue prescribed HEP. Patient was educated on signs and symptoms of infection and other risk factors and advised to seek medical attention should they occur.  Patient verbalized understanding of these instructions and education.   Patient Verbal Consent Given: Yes Education Handout Provided: Yes Muscles Treated: Lt glutes Electrical Stimulation Performed: No Treatment Response/Outcome: twitch response and release  Soft tissue mobilization to Lt glutes  Neuromuscular re-education: Standing chop 2 x 10 bil 5 lbs Pallof press 2 x 10 bil red band  Therapeutic activities: Squats 2 x 10 3 way kick 10x each, bil Heels slams 2 x 10   08/28/23 Neuromuscular re-education: Seated hip adduction ball press with transversus abdominus and pelvic floor muscle 2 x 10 Bridge with hip adduction, transversus abdominus, and pelvic floor muscle 2 x 10 Supine leg extensions 10x bil Table top 10 breaths Modified dead bug 10x bil Bear crawl plank 10x Exercises: Lower trunk rotation 2 x 10 Cat cow 2 x 10 Supine piriformis stretch 60 sec bil Child's pose 10 breaths   PATIENT EDUCATION:  Education details: See above Person educated: Patient Education method: Programmer, multimedia, Demonstration, Tactile cues, Verbal cues, and  Handouts Education comprehension: verbalized understanding  HOME EXERCISE PROGRAM: [id number]  ASSESSMENT:  CLINICAL IMPRESSION: Pt doing very well and saw some good improvement with Lt hip pain after dry needling last session, but is still having some discomfort. Dry needling performed again today with continued significant twitch response. We performed soft tissue mobilization to low back after due to significant tightness; believe that fascial restriction in low back may be part of the reason her LT hip stays tight and restricted. Scar tissue mobilization also performed to low back to help improve overall mobility. She did very well with deep fascial stretches and addition of chop/lift exercises to help mimic various tennis swings. She will continue to benefit from skilled PT intervention in order to decrease Lt hip pain, improve urinary incontinence, return to running/tennis, and begin/progress functional strengthening program.   OBJECTIVE IMPAIRMENTS: decreased activity tolerance, decreased coordination, decreased endurance, decreased mobility, decreased strength, increased fascial restrictions, increased muscle spasms, impaired tone, postural dysfunction, and pain.   ACTIVITY LIMITATIONS: sitting, continence, and running/jumping  PARTICIPATION LIMITATIONS: community activity and exercise (tennis)  PERSONAL FACTORS: 3+ comorbidities: medical history  are also affecting patient's functional outcome.   REHAB POTENTIAL: Good  CLINICAL DECISION MAKING: Stable/uncomplicated  EVALUATION COMPLEXITY: Low   GOALS: Goals reviewed with patient? Yes  SHORT TERM GOALS: Target date: 09/06/2023 - updated 09/11/23   Pt will be independent with HEP.   Baseline: Goal status: MET 09/11/23  2.  Pt will be independent with the knack, urge suppression technique, and double voiding in order to improve bladder habits and decrease urinary incontinence.   Baseline:  Goal status: MET 09/11/23  3.  Pt  will report 25% improvement in urinary incontinence and urgency.  Baseline:  Goal status: MET 09/11/23  4.  Pt will increase pelvic floor muscle strength to 3/5.  Baseline:  Goal status: MET 09/11/23   LONG TERM GOALS: Target date: 01/24/24 - updated 09/11/23  Pt will be independent with advanced HEP.   Baseline:  Goal status: IN PROGRESS 09/11/23  2.  Pt will report 75% improvement in urinary incontinence. Baseline:  Goal status: IN PROGRESS 09/11/23  3.  Pt will be able to go 2-3 hours in between voids without urgency or incontinence in order to improve QOL and perform all functional activities with less difficulty.   Baseline:  Goal status: IN PROGRESS 09/11/23  4.  Pt will be able to run, jump, and return to playing tennis without concerning issues with urinary incontinence.  Baseline:  Goal status: IN PROGRESS 09/11/23  5.  Pt will report no leaks with laughing, coughing, sneezing in order to improve comfort with interpersonal relationships and community activities.   Baseline:  Goal status: IN PROGRESS 09/11/23  6.  Pt will improve pelvic floor muscle strength to 4/5.  Baseline:  Goal status: IN PROGRESS 09/11/23  PLAN:  PT FREQUENCY: 1-2x/week  PT DURATION: 6 months  PLANNED INTERVENTIONS: 97110-Therapeutic exercises, 97530- Therapeutic activity, 97112- Neuromuscular re-education, 97535- Self Care, 98119- Manual therapy, Dry Needling, and Biofeedback  PLAN FOR NEXT SESSION: Progress core strengthening program to include standing exercises.    Julio Alm, PT, DPT03/31/259:26 AM

## 2023-09-12 ENCOUNTER — Telehealth: Admitting: Family Medicine

## 2023-09-12 DIAGNOSIS — J329 Chronic sinusitis, unspecified: Secondary | ICD-10-CM

## 2023-09-12 NOTE — Progress Notes (Signed)
  Because you were just on antibiotics and have worsened, your condition warrants further evaluation and I recommend that you be seen in a face-to-face visit at your PCP and or local urgent care.   NOTE: There will be NO CHARGE for this E-Visit   If you are having a true medical emergency, please call 911.

## 2023-09-13 ENCOUNTER — Encounter: Payer: Self-pay | Admitting: Family Medicine

## 2023-09-13 ENCOUNTER — Ambulatory Visit: Admitting: Family Medicine

## 2023-09-13 VITALS — BP 129/77 | HR 59 | Temp 97.9°F | Ht 67.0 in | Wt 189.2 lb

## 2023-09-13 DIAGNOSIS — J309 Allergic rhinitis, unspecified: Secondary | ICD-10-CM | POA: Diagnosis not present

## 2023-09-13 DIAGNOSIS — J45990 Exercise induced bronchospasm: Secondary | ICD-10-CM | POA: Diagnosis not present

## 2023-09-13 MED ORDER — LEVOFLOXACIN 500 MG PO TABS: 500.0000 mg | ORAL_TABLET | Freq: Every day | ORAL | 0 refills | Status: AC

## 2023-09-13 MED ORDER — PREDNISONE 20 MG PO TABS: 40.0000 mg | ORAL_TABLET | Freq: Every day | ORAL | 0 refills | Status: DC

## 2023-09-13 NOTE — Assessment & Plan Note (Signed)
 Mild flare of recently has since resolved.  She is currently following with allergy as above and is on Singulair, Zyrtec, and albuterol.

## 2023-09-13 NOTE — Progress Notes (Signed)
   Cindy Pittman is a 48 y.o. female who presents today for an office visit.  Assessment/Plan:  New/Acute Problems: Sinusitis  Symptoms have been persistent for the last several weeks.  She had some improvement with Augmentin however symptoms quickly returned afterwards.  Will start course of Levaquin.  Will also repeat course of prednisone as she did well with this previously as well.  She can continue her home allergy meds as well.  We discussed reasons to return to care.  She has upcoming appointment with ENT as well.   Chronic Problems Addressed Today: Allergic rhinitis Follows with allergist.  Likely contributing to above sinus infection.  She will continue her Flonase, Astelin, Singulair, and Zyrtec.  Will give brief prednisone burst today as above which should help.  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  She is currently following with allergy as above and is on Singulair, Zyrtec, and albuterol.     Subjective:  HPI:  See assessment / plan for status of chronic conditions. Patient is here with sinus congestion. This has been going on for several weeks.  She did go to urgent care with cough and congestion about a month or so ago.  Got started on prednisone and Augmentin.  Her sinus congestion did improve with this however over the last few weeks has slowly returned.  We had a virtual visit with her a few weeks ago for her persistent cough.  This has improved significantly.  Main concern at this point is sinus congestion and pressure.  A lot of thick green mucus production as well.  Occasionally tinged with blood.  She is following with ENT and will be having sinus surgery next month.       Objective:  Physical Exam: BP 129/77   Pulse (!) 59   Temp 97.9 F (36.6 C) (Temporal)   Ht 5\' 7"  (1.702 m)   Wt 189 lb 3.2 oz (85.8 kg)   SpO2 98%   BMI 29.63 kg/m   Gen: No acute distress, resting comfortably HEENT: TMs with clear effusion.  OP  erythematous.  Nasal mucosa erythematous and boggy bilaterally. CV: Regular rate and rhythm with no murmurs appreciated Pulm: Normal work of breathing, clear to auscultation bilaterally with no crackles, wheezes, or rhonchi Neuro: Grossly normal, moves all extremities Psych: Normal affect and thought content      Aldona Bryner M. Jimmey Ralph, MD 09/13/2023 7:48 AM

## 2023-09-13 NOTE — Assessment & Plan Note (Signed)
 Follows with allergist.  Likely contributing to above sinus infection.  She will continue her Flonase, Astelin, Singulair, and Zyrtec.  Will give brief prednisone burst today as above which should help.

## 2023-09-13 NOTE — Patient Instructions (Signed)
 It was very nice to see you today!  Please start the Levaquin and prednisone.  Make sure that you are getting plenty of fluids.  Let us know if not improving.  Return if symptoms worsen or fail to improve.   Take care, Dr Jimmey Ralph  PLEASE NOTE:  If you had any lab tests, please let us know if you have not heard back within a few days. You may see your results on mychart before we have a chance to review them but we will give you a call once they are reviewed by [place].   If we ordered any referrals today, please let us know if you have not heard from their office within the next week.   If you had any urgent prescriptions sent in today, please check with the pharmacy within an hour of our visit to make sure the prescription was transmitted appropriately.   Please try these tips to maintain a healthy lifestyle:  Eat at least 3 REAL meals and 1-2 snacks per day.  Aim for no more than 5 hours between eating.  If you eat breakfast, please do so within one hour of getting up.   Each meal should contain half fruits/vegetables, one quarter protein, and one quarter carbs (no bigger than a computer mouse)  Cut down on sweet beverages. This includes juice, soda, and sweet tea.   Drink at least 1 glass of water with each meal and aim for at least 8 glasses per day  Exercise at least 150 minutes every week.

## 2023-09-28 ENCOUNTER — Ambulatory Visit: Payer: Self-pay | Attending: Obstetrics and Gynecology

## 2023-09-28 DIAGNOSIS — M6281 Muscle weakness (generalized): Secondary | ICD-10-CM | POA: Diagnosis present

## 2023-09-28 DIAGNOSIS — R279 Unspecified lack of coordination: Secondary | ICD-10-CM | POA: Diagnosis present

## 2023-09-28 DIAGNOSIS — R293 Abnormal posture: Secondary | ICD-10-CM | POA: Diagnosis present

## 2023-09-28 DIAGNOSIS — M62838 Other muscle spasm: Secondary | ICD-10-CM | POA: Insufficient documentation

## 2023-09-28 NOTE — Therapy (Signed)
 OUTPATIENT PHYSICAL THERAPY FEMALE PELVIC TREATMENT   Patient Name: Cindy Pittman MRN: [id number] DOB:[birth year], 48 y.o., female Today's Date: 09/28/2023  END OF SESSION:  PT End of Session - 09/28/23 1534     Visit Number 6    Date for PT Re-Evaluation 01/24/24    Authorization Type [employer]    PT Start Time 1530    PT Stop Time 1610    PT Time Calculation (min) 40 min    Activity Tolerance Patient tolerated treatment well    Behavior During Therapy WFL for tasks assessed/performed             Past Medical History:  Diagnosis Date   History of ear infections    Hyperlipidemia     Ideal LDL goal = < 70 based on NMR Lipoprofile   Past Surgical History:  Procedure Laterality Date   Labial Cystectomy     LUMBAR DISC SURGERY  1990   L4, Dr Selestino Dakin   Otic Tubes     SEPTOPLASTY     WISDOM TOOTH EXTRACTION     Patient Active Problem List   Diagnosis Date Noted   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   Chronic rhinitis 08/08/2023   Epistaxis 08/08/2023   Chronic pansinusitis 06/29/2023   Hypertrophy of nasal turbinates 06/29/2023   Adjustment disorder 04/06/2023   Dermatitis 01/07/2022   Thyroid nodule 12/29/2020   Overweight 12/23/2019   Allergic rhinitis 12/11/2018   Dyslipidemia 10/12/2018   History of nephrolithiasis 10/12/2018   Exercise induced bronchospasm 06/30/2008   PCOS (polycystic ovarian syndrome) 07/18/2006    PCP: Rodney Clamp, MD  REFERRING PROVIDER: Rodney Clamp, MD Ashby Lawman, MD   REFERRING DIAG: N39.3 (ICD-10-CM) - Stress incontinence (female) (female)  THERAPY DIAG:  Muscle weakness (generalized)  Unspecified lack of coordination  Abnormal posture  Other muscle spasm  Rationale for Evaluation and Treatment: Rehabilitation  ONSET DATE: 1 year  SUBJECTIVE:                                                                                                                                                                                            SUBJECTIVE STATEMENT: Pt states that she played tennis over the weekend and she did not leak, but she held back due to fear of leaking. She reports that it felt different from running.   PAIN: 09/28/23 Are you having pain? Yes NPRS scale: 3/10 Pain location:  Lt hip (anterior/posterior)  Pain type: aching Pain description: intermittent   Aggravating factors: sitting all day, not being as active Relieving factors: being active   PRECAUTIONS: None  RED FLAGS: None   WEIGHT  BEARING RESTRICTIONS: No  FALLS:  Has patient fallen in last 6 months? No  OCCUPATION: high school counselor   ACTIVITY LEVEL : tennis, walking, running   PLOF: Independent  PATIENT GOALS: get back to running and playing tennis without leaking  PERTINENT HISTORY:  Labial cystectomy, lumbar disc surgery, PCOS, endometriosis, abdominal laparoscopy years ago for endo   BOWEL MOVEMENT: Pain with bowel movement: No Type of bowel movement:Frequency 2x/day and Strain no Fully empty rectum: Yes:   Leakage: No Pads: No Fiber supplement/laxative No  URINATION: Pain with urination: No Fully empty bladder: Yes: - Stream: Strong Urgency: Yes  Frequency: every hour when drinking a lot of water, currently every 3-4 hours; no usual nocturia  Leakage: Urge to void, Walking to the bathroom, Coughing, Sneezing, Laughing, and jumping Pads: No  INTERCOURSE:  Not sexually active, but has been painful in the past  PREGNANCY: NA  PROLAPSE: None   OBJECTIVE:  Note: Objective measures were completed at Evaluation unless otherwise noted. 08/09/23:   COGNITION: Overall cognitive status: Within functional limits for tasks assessed     SENSATION: Light touch: Appears intact  FUNCTIONAL TESTS:  Squat: Lt LE ER, tightness on LT side and small weight shift to the Lt Single leg stance:  Rt: Lt pelvic drop  Lt: Rt pelvic drop Curl-up test: upper abdominal distortion    GAIT: Assistive device  utilized: None Comments: WNL  POSTURE: rounded shoulders, forward head, increased thoracic kyphosis, and Rt posterior rotation, Lt iliac crest elevation, increased muscle tone in Rt lumbar/Lt thoracic paraspinals   LUMBARAROM/PROM:  A/PROM A/PROM  Eval (% available)  Flexion 75  Extension 50  Right lateral flexion 100  Left lateral flexion 75  Right rotation 75  Left rotation 50   (Blank rows = not tested)  PALPATION:  Pelvic Alignment: Rt posterior/Lt anterior rotation   Abdominal: Lt lower quadrant tenderness; lower abdominal restriction                External Perineal Exam: WNL                             Internal Pelvic Floor: tenderness and restriction throughout Lt deep pelvic floor muscles  Patient confirms identification and approves PT to assess internal pelvic floor and treatment Yes  PELVIC MMT:   MMT eval  Vaginal 2/5, 3 second hold, 8 repeat contractions  Diastasis Recti WNL, but distortion  (Blank rows = not tested)        TONE: High Lt levator ani  PROLAPSE: WNL  TODAY'S TREATMENT:                                                                                                                              DATE:  09/28/23 Neuromuscular re-education: Trampoline: Side to side 2 min Front back 2 min bil Up and down narrow stance 2 min Exercises: Seated piriformis stretch 60 seconds  bil  Kneeling hip flexor stretch 60 seconds bil Lateral standing side body stretch 10 lbs 10x bil Therapeutic activities: Lateral shuffle squat 10x bil Lateral shuffle with rotation 10x bil 5 lbs Kettle bell 3 x 10, 10x 10lbs, 2 x 10 15lbs Jump through lunges 2 x 10 Lateral high knees 2 x 5 bil   09/11/23 Manual: Trigger Point Dry Needling  Initial Treatment: Pt instructed on Dry Needling rational, procedures, and possible side effects. Pt instructed to expect mild to moderate muscle soreness later in the day and/or into the next day.  Pt instructed in methods  to reduce muscle soreness. Pt instructed to continue prescribed HEP. Patient was educated on signs and symptoms of infection and other risk factors and advised to seek medical attention should they occur.  Patient verbalized understanding of these instructions and education.   Patient Verbal Consent Given: Yes Education Handout Provided: Yes Muscles Treated: Lt glutes Electrical Stimulation Performed: No Treatment Response/Outcome: twitch response and release  Soft tissue mobilization to Lt glutes and lumbar paraspinals Scar tissue mobilization to low back Neuromuscular re-education: Modified side plank with clam shell 2 x 5 Chop 10x bil 7lbs Lift 10x bil 3lbs Exercises: Seated lateral side body stretch 15 breaths bil Seated piriformis stretch 60 sec  Seated side body stretch in "P" 10 breaths bil  [id number] Manual: Trigger Point Dry Needling  Initial Treatment: Pt instructed on Dry Needling rational, procedures, and possible side effects. Pt instructed to expect mild to moderate muscle soreness later in the day and/or into the next day.  Pt instructed in methods to reduce muscle soreness. Pt instructed to continue prescribed HEP. Patient was educated on signs and symptoms of infection and other risk factors and advised to seek medical attention should they occur.  Patient verbalized understanding of these instructions and education.   Patient Verbal Consent Given: Yes Education Handout Provided: Yes Muscles Treated: Lt glutes Electrical Stimulation Performed: No Treatment Response/Outcome: twitch response and release  Soft tissue mobilization to Lt glutes  Neuromuscular re-education: Standing chop 2 x 10 bil 5 lbs Pallof press 2 x 10 bil red band  Therapeutic activities: Squats 2 x 10 3 way kick 10x each, bil Heels slams 2 x 10    PATIENT EDUCATION:  Education details: See above Person educated: Patient Education method: Explanation, Demonstration, Tactile cues,  Verbal cues, and Handouts Education comprehension: verbalized understanding  HOME EXERCISE PROGRAM: [id number]  ASSESSMENT:  CLINICAL IMPRESSION: Pt did very well on trampoline to work on impact training of the pelvic floor. She did very well with this and performing lateral shuffles with squat. We progress the lateral shuffles to include 5lb weight and rotation like she was swinging a tennis racket after shuffling. Great form with kettle bell swings. She reported no issues with leaking or feeling like she had to leak during any exercises today. She felt the most sensation in the vagina with lateral high knees and jump through lunges. She will continue to benefit from skilled PT intervention in order to decrease Lt hip pain, improve urinary incontinence, return to running/tennis, and begin/progress functional strengthening program.   OBJECTIVE IMPAIRMENTS: decreased activity tolerance, decreased coordination, decreased endurance, decreased mobility, decreased strength, increased fascial restrictions, increased muscle spasms, impaired tone, postural dysfunction, and pain.   ACTIVITY LIMITATIONS: sitting, continence, and running/jumping  PARTICIPATION LIMITATIONS: community activity and exercise (tennis)  PERSONAL FACTORS: 3+ comorbidities: medical history  are also affecting patient's functional outcome.   REHAB POTENTIAL: Good  CLINICAL DECISION MAKING: Stable/uncomplicated  EVALUATION COMPLEXITY: Low   GOALS: Goals reviewed with patient? Yes  SHORT TERM GOALS: Target date: 09/06/2023 - updated 09/11/23   Pt will be independent with HEP.   Baseline: Goal status: MET 09/11/23  2.  Pt will be independent with the knack, urge suppression technique, and double voiding in order to improve bladder habits and decrease urinary incontinence.   Baseline:  Goal status: MET 09/11/23  3.  Pt will report 25% improvement in urinary incontinence and urgency.  Baseline:  Goal status: MET  09/11/23  4.  Pt will increase pelvic floor muscle strength to 3/5.  Baseline:  Goal status: MET 09/11/23   LONG TERM GOALS: Target date: 01/24/24 - updated 09/11/23  Pt will be independent with advanced HEP.   Baseline:  Goal status: IN PROGRESS 09/11/23  2.  Pt will report 75% improvement in urinary incontinence. Baseline:  Goal status: IN PROGRESS 09/11/23  3.  Pt will be able to go 2-3 hours in between voids without urgency or incontinence in order to improve QOL and perform all functional activities with less difficulty.   Baseline:  Goal status: IN PROGRESS 09/11/23  4.  Pt will be able to run, jump, and return to playing tennis without concerning issues with urinary incontinence.  Baseline:  Goal status: IN PROGRESS 09/11/23  5.  Pt will report no leaks with laughing, coughing, sneezing in order to improve comfort with interpersonal relationships and community activities.   Baseline:  Goal status: IN PROGRESS 09/11/23  6.  Pt will improve pelvic floor muscle strength to 4/5.  Baseline:  Goal status: IN PROGRESS 09/11/23  PLAN:  PT FREQUENCY: 1-2x/week  PT DURATION: 6 months  PLANNED INTERVENTIONS: 97110-Therapeutic exercises, 97530- Therapeutic activity, 97112- Neuromuscular re-education, 97535- Self Care, 16109- Manual therapy, Dry Needling, and Biofeedback  PLAN FOR NEXT SESSION: Progress core strengthening program to include standing exercises.    Verlena Glenn, PT, DPT04/17/254:11 PM

## 2023-10-19 ENCOUNTER — Telehealth: Payer: Self-pay | Admitting: *Deleted

## 2023-10-19 DIAGNOSIS — J343 Hypertrophy of nasal turbinates: Secondary | ICD-10-CM | POA: Diagnosis not present

## 2023-10-19 DIAGNOSIS — J349 Unspecified disorder of nose and nasal sinuses: Secondary | ICD-10-CM | POA: Diagnosis not present

## 2023-10-19 HISTORY — PX: OTHER SURGICAL HISTORY: SHX169

## 2023-10-19 NOTE — Telephone Encounter (Signed)
 confirmed appt for 5/12 - included address and time - [facility] 10/19/23

## 2023-10-23 ENCOUNTER — Encounter (INDEPENDENT_AMBULATORY_CARE_PROVIDER_SITE_OTHER): Payer: Self-pay | Admitting: Otolaryngology

## 2023-10-23 ENCOUNTER — Ambulatory Visit (INDEPENDENT_AMBULATORY_CARE_PROVIDER_SITE_OTHER): Payer: 59 | Admitting: Otolaryngology

## 2023-10-23 DIAGNOSIS — Z9889 Other specified postprocedural states: Secondary | ICD-10-CM

## 2023-10-23 DIAGNOSIS — J310 Chronic rhinitis: Secondary | ICD-10-CM

## 2023-10-23 NOTE — Progress Notes (Signed)
 Doyle splints removed. Turbinates are healing well.   Both [place] debrided.  Nasal saline irrigation.  Recheck in 3 weeks.

## 2023-10-24 ENCOUNTER — Telehealth (INDEPENDENT_AMBULATORY_CARE_PROVIDER_SITE_OTHER): Payer: Self-pay

## 2023-11-02 ENCOUNTER — Other Ambulatory Visit (INDEPENDENT_AMBULATORY_CARE_PROVIDER_SITE_OTHER): Payer: Self-pay | Admitting: Otolaryngology

## 2023-11-02 MED ORDER — AMOXICILLIN-POT CLAVULANATE 875-125 MG PO TABS: 1.0000 | ORAL_TABLET | Freq: Two times a day (BID) | ORAL | 0 refills | Status: AC

## 2023-11-03 NOTE — Telephone Encounter (Signed)
 Done

## 2023-11-15 ENCOUNTER — Encounter (INDEPENDENT_AMBULATORY_CARE_PROVIDER_SITE_OTHER): Payer: Self-pay | Admitting: Otolaryngology

## 2023-11-15 ENCOUNTER — Ambulatory Visit (INDEPENDENT_AMBULATORY_CARE_PROVIDER_SITE_OTHER): Admitting: Otolaryngology

## 2023-11-15 VITALS — BP 123/83 | HR 66

## 2023-11-15 DIAGNOSIS — Z9889 Other specified postprocedural states: Secondary | ICD-10-CM

## 2023-11-15 DIAGNOSIS — J310 Chronic rhinitis: Secondary | ICD-10-CM

## 2023-11-15 NOTE — Progress Notes (Signed)
Turbinates are healing well.  Both [place] debrided.  Nasal saline irrigation as needed.  Recheck in 6 months.

## 2023-11-27 ENCOUNTER — Ambulatory Visit

## 2023-12-18 ENCOUNTER — Ambulatory Visit: Attending: Obstetrics and Gynecology

## 2023-12-18 DIAGNOSIS — M62838 Other muscle spasm: Secondary | ICD-10-CM | POA: Diagnosis present

## 2023-12-18 DIAGNOSIS — R293 Abnormal posture: Secondary | ICD-10-CM | POA: Diagnosis present

## 2023-12-18 DIAGNOSIS — M6281 Muscle weakness (generalized): Secondary | ICD-10-CM | POA: Diagnosis present

## 2023-12-18 DIAGNOSIS — R279 Unspecified lack of coordination: Secondary | ICD-10-CM | POA: Insufficient documentation

## 2023-12-18 NOTE — Therapy (Signed)
 OUTPATIENT PHYSICAL THERAPY FEMALE PELVIC TREATMENT   Patient Name: Cindy Pittman MRN: [id number] DOB:1975/09/03, 48 y.o., female Today's Date: 12/18/2023  END OF SESSION:  PT End of Session - 12/18/23 0933     Visit Number 7    Date for PT Re-Evaluation 01/24/24    Authorization Type [employer]    PT Start Time 0930    PT Stop Time 1010    PT Time Calculation (min) 40 min    Activity Tolerance Patient tolerated treatment well    Behavior During Therapy WFL for tasks assessed/performed           Past Medical History:  Diagnosis Date   History of ear infections    Hyperlipidemia     Ideal LDL goal = < 70 based on NMR Lipoprofile   Past Surgical History:  Procedure Laterality Date   Bilateral turbinate reduction, concha bullosa Bilateral 10/19/2023   Labial Cystectomy     LUMBAR DISC SURGERY  06/13/1988   L4, Dr Carles   Otic Tubes     SEPTOPLASTY     WISDOM TOOTH EXTRACTION     Patient Active Problem List   Diagnosis Date Noted   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   Chronic rhinitis 08/08/2023   Epistaxis 08/08/2023   Chronic pansinusitis 06/29/2023   Hypertrophy of nasal turbinates 06/29/2023   Adjustment disorder 04/06/2023   Dermatitis 01/07/2022   Thyroid  nodule 12/29/2020   Overweight 12/23/2019   Allergic rhinitis 12/11/2018   Dyslipidemia 10/12/2018   History of nephrolithiasis 10/12/2018   Exercise induced bronchospasm 06/30/2008   PCOS (polycystic ovarian syndrome) 07/18/2006    PCP: Kennyth Worth HERO, MD  REFERRING PROVIDER: Kennyth Worth HERO, MD Latisha Medford, MD   REFERRING DIAG: N39.3 (ICD-10-CM) - Stress incontinence (female) (female)  THERAPY DIAG:  Muscle weakness (generalized)  Unspecified lack of coordination  Abnormal posture  Other muscle spasm  Rationale for Evaluation and Treatment: Rehabilitation  ONSET DATE: 1 year  SUBJECTIVE:                                                                                                                                                                                            SUBJECTIVE STATEMENT: Pt states that she had a long period in which she was not active due to having surgery on her nose. She has had one episode of urinary incontinence with a cough or sneeze. She has not played tennis.   PAIN: 12/18/23 Are you having pain? Yes NPRS scale: 3/10 Pain location: Lt hip (anterior/posterior)  Pain type: aching Pain description: intermittent   Aggravating factors: sitting all day, not being as active Relieving  factors: being active   PRECAUTIONS: None  RED FLAGS: None   WEIGHT BEARING RESTRICTIONS: No  FALLS:  Has patient fallen in last 6 months? No  OCCUPATION: high school counselor   ACTIVITY LEVEL : tennis, walking, running   PLOF: Independent  PATIENT GOALS: get back to running and playing tennis without leaking  PERTINENT HISTORY:  Labial cystectomy, lumbar disc surgery, PCOS, endometriosis, abdominal laparoscopy years ago for endo   BOWEL MOVEMENT: Pain with bowel movement: No Type of bowel movement:Frequency 2x/day and Strain no Fully empty rectum: Yes:   Leakage: No Pads: No Fiber supplement/laxative No  URINATION: Pain with urination: No Fully empty bladder: Yes: - Stream: Strong Urgency: Yes  Frequency: every hour when drinking a lot of water, currently every 3-4 hours; no usual nocturia  Leakage: Urge to void, Walking to the bathroom, Coughing, Sneezing, Laughing, and jumping Pads: No  INTERCOURSE:  Not sexually active, but has been painful in the past  PREGNANCY: NA  PROLAPSE: None   OBJECTIVE:  Note: Objective measures were completed at Evaluation unless otherwise noted. 08/09/23:   COGNITION: Overall cognitive status: Within functional limits for tasks assessed     SENSATION: Light touch: Appears intact  FUNCTIONAL TESTS:  Squat: Lt LE ER, tightness on LT side and small weight shift to the Lt Single leg stance:  Rt: Lt  pelvic drop  Lt: Rt pelvic drop Curl-up test: upper abdominal distortion    GAIT: Assistive device utilized: None Comments: WNL  POSTURE: rounded shoulders, forward head, increased thoracic kyphosis, and Rt posterior rotation, Lt iliac crest elevation, increased muscle tone in Rt lumbar/Lt thoracic paraspinals   LUMBARAROM/PROM:  A/PROM A/PROM  Eval (% available)  Flexion 75  Extension 50  Right lateral flexion 100  Left lateral flexion 75  Right rotation 75  Left rotation 50   (Blank rows = not tested)  PALPATION:  Pelvic Alignment: Rt posterior/Lt anterior rotation   Abdominal: Lt lower quadrant tenderness; lower abdominal restriction                External Perineal Exam: WNL                             Internal Pelvic Floor: tenderness and restriction throughout Lt deep pelvic floor muscles  Patient confirms identification and approves PT to assess internal pelvic floor and treatment Yes  PELVIC MMT:   MMT eval  Vaginal 2/5, 3 second hold, 8 repeat contractions  Diastasis Recti WNL, but distortion  (Blank rows = not tested)        TONE: High Lt levator ani  PROLAPSE: WNL  TODAY'S TREATMENT:                                                                                                                              DATE:  12/18/23 Neuromuscular re-education: Trampoline: Lateral bounces 50x Staggered bounces 50x bil Jump squats  2 x 10 Therapeutic activities: 8 inch step up unilateral rows at pulley with 7lbs and then 10 lbs, 2 x 10 bil Lateral 8 inch box hops 3 x 10 Step back rotational lunges 2 x 10 Jump through lunges 2 x 10 Lateral shuffle with rotation 10x bil 5 lbs   09/28/23 Neuromuscular re-education: Trampoline: Side to side 2 min Front back 2 min bil Up and down narrow stance 2 min Exercises: Seated piriformis stretch 60 seconds bil  Kneeling hip flexor stretch 60 seconds bil Lateral standing side body stretch 10 lbs 10x  bil Therapeutic activities: Lateral shuffle squat 10x bil Lateral shuffle with rotation 10x bil 5 lbs Kettle bell 3 x 10, 10x 10lbs, 2 x 10 15lbs Jump through lunges 2 x 10 Lateral high knees 2 x 5 bil   09/11/23 Manual: Trigger Point Dry Needling  Initial Treatment: Pt instructed on Dry Needling rational, procedures, and possible side effects. Pt instructed to expect mild to moderate muscle soreness later in the day and/or into the next day.  Pt instructed in methods to reduce muscle soreness. Pt instructed to continue prescribed HEP. Patient was educated on signs and symptoms of infection and other risk factors and advised to seek medical attention should they occur.  Patient verbalized understanding of these instructions and education.   Patient Verbal Consent Given: Yes Education Handout Provided: Yes Muscles Treated: Lt glutes Electrical Stimulation Performed: No Treatment Response/Outcome: twitch response and release  Soft tissue mobilization to Lt glutes and lumbar paraspinals Scar tissue mobilization to low back Neuromuscular re-education: Modified side plank with clam shell 2 x 5 Chop 10x bil 7lbs Lift 10x bil 3lbs Exercises: Seated lateral side body stretch 15 breaths bil Seated piriformis stretch 60 sec  Seated side body stretch in P 10 breaths bil    PATIENT EDUCATION:  Education details: See above Person educated: Patient Education method: Programmer, multimedia, Demonstration, Tactile cues, Verbal cues, and Handouts Education comprehension: verbalized understanding  HOME EXERCISE PROGRAM: [id number]  ASSESSMENT:  CLINICAL IMPRESSION: Pt doing very well overall with only one episode of urinary incontinence in the last several months. However, she has not returned to running. She had several weeks without exercise to allow for nasal surgery to heal. Since she feels ready to reutrn to more difficult strength training, we performed trampoline, impact, and rotational  activities to work on pelvic floor control in higher level exercises. She tolerated all activities very well with no sensation of feeling like she may leak. She will continue to benefit from skilled PT intervention in order to decrease Lt hip pain, improve urinary incontinence, return to running/tennis, and begin/progress functional strengthening program.   OBJECTIVE IMPAIRMENTS: decreased activity tolerance, decreased coordination, decreased endurance, decreased mobility, decreased strength, increased fascial restrictions, increased muscle spasms, impaired tone, postural dysfunction, and pain.   ACTIVITY LIMITATIONS: sitting, continence, and running/jumping  PARTICIPATION LIMITATIONS: community activity and exercise (tennis)  PERSONAL FACTORS: 3+ comorbidities: medical history are also affecting patient's functional outcome.   REHAB POTENTIAL: Good  CLINICAL DECISION MAKING: Stable/uncomplicated  EVALUATION COMPLEXITY: Low   GOALS: Goals reviewed with patient? Yes  SHORT TERM GOALS: Updated 12/18/23   Pt will be independent with HEP.   Baseline: Goal status: MET 09/11/23  2.  Pt will be independent with the knack, urge suppression technique, and double voiding in order to improve bladder habits and decrease urinary incontinence.   Baseline:  Goal status: MET 09/11/23  3.  Pt will report 25% improvement in urinary incontinence and urgency.  Baseline:  Goal status: MET 09/11/23  4.  Pt will increase pelvic floor muscle strength to 3/5.  Baseline:  Goal status: MET 09/11/23   LONG TERM GOALS: Updated 12/18/23  Pt will be independent with advanced HEP.   Baseline:  Goal status: IN PROGRESS 12/18/23  2.  Pt will report 75% improvement in urinary incontinence. Baseline:  Goal status: IN PROGRESS 12/18/23  3.  Pt will be able to go 2-3 hours in between voids without urgency or incontinence in order to improve QOL and perform all functional activities with less difficulty.    Baseline:  Goal status: IN PROGRESS 12/18/23  4.  Pt will be able to run, jump, and return to playing tennis without concerning issues with urinary incontinence.  Baseline:  Goal status: IN PROGRESS 12/18/23  5.  Pt will report no leaks with laughing, coughing, sneezing in order to improve comfort with interpersonal relationships and community activities.   Baseline: one episode in the last two months Goal status: IN PROGRESS 12/18/23  6.  Pt will improve pelvic floor muscle strength to 4/5.  Baseline:  Goal status: not assessed today 12/18/23  PLAN:  PT FREQUENCY: 1-2x/week  PT DURATION: 6 months  PLANNED INTERVENTIONS: 97110-Therapeutic exercises, 97530- Therapeutic activity, 97112- Neuromuscular re-education, 97535- Self Care, [id number]- Manual therapy, Dry Needling, and Biofeedback  PLAN FOR NEXT SESSION: Progress core strengthening program to include standing exercises.    Josette Mares, PT, DPT07/12/2508:09 AM

## 2024-01-08 ENCOUNTER — Encounter

## 2024-01-29 ENCOUNTER — Ambulatory Visit

## 2024-02-19 ENCOUNTER — Ambulatory Visit

## 2024-04-08 ENCOUNTER — Ambulatory Visit: Payer: BC Managed Care – PPO | Admitting: Family Medicine

## 2024-04-08 ENCOUNTER — Ambulatory Visit: Payer: Self-pay | Admitting: Family Medicine

## 2024-04-08 ENCOUNTER — Encounter: Payer: Self-pay | Admitting: Family Medicine

## 2024-04-08 VITALS — BP 136/82 | HR 51 | Temp 98.2°F | Resp 14 | Ht 67.0 in | Wt 186.4 lb

## 2024-04-08 DIAGNOSIS — E785 Hyperlipidemia, unspecified: Secondary | ICD-10-CM | POA: Diagnosis not present

## 2024-04-08 DIAGNOSIS — E559 Vitamin D deficiency, unspecified: Secondary | ICD-10-CM

## 2024-04-08 DIAGNOSIS — Z23 Encounter for immunization: Secondary | ICD-10-CM

## 2024-04-08 DIAGNOSIS — R5383 Other fatigue: Secondary | ICD-10-CM

## 2024-04-08 DIAGNOSIS — Z131 Encounter for screening for diabetes mellitus: Secondary | ICD-10-CM

## 2024-04-08 DIAGNOSIS — E041 Nontoxic single thyroid nodule: Secondary | ICD-10-CM

## 2024-04-08 DIAGNOSIS — G4730 Sleep apnea, unspecified: Secondary | ICD-10-CM | POA: Diagnosis not present

## 2024-04-08 DIAGNOSIS — Z0001 Encounter for general adult medical examination with abnormal findings: Secondary | ICD-10-CM | POA: Diagnosis not present

## 2024-04-08 DIAGNOSIS — J45990 Exercise induced bronchospasm: Secondary | ICD-10-CM

## 2024-04-08 DIAGNOSIS — J309 Allergic rhinitis, unspecified: Secondary | ICD-10-CM

## 2024-04-08 LAB — VITAMIN D 25 HYDROXY (VIT D DEFICIENCY, FRACTURES): VITD: 66.79 ng/mL (ref 30.00–100.00)

## 2024-04-08 LAB — CBC
HCT: 41.6 % (ref 36.0–46.0)
Hemoglobin: 14.1 g/dL (ref 12.0–15.0)
MCHC: 33.9 g/dL (ref 30.0–36.0)
MCV: 89.1 fl (ref 78.0–100.0)
Platelets: 320 K/uL (ref 150.0–400.0)
RBC: 4.67 Mil/uL (ref 3.87–5.11)
RDW: 12.9 % (ref 11.5–15.5)
WBC: 7.1 K/uL (ref 4.0–10.5)

## 2024-04-08 LAB — COMPREHENSIVE METABOLIC PANEL WITH GFR
ALT: 21 U/L (ref 0–35)
AST: 15 U/L (ref 0–37)
Albumin: 4.2 g/dL (ref 3.5–5.2)
Alkaline Phosphatase: 102 U/L (ref 39–117)
BUN: 12 mg/dL (ref 6–23)
CO2: 24 meq/L (ref 19–32)
Calcium: 9.2 mg/dL (ref 8.4–10.5)
Chloride: 105 meq/L (ref 96–112)
Creatinine, Ser: 0.8 mg/dL (ref 0.40–1.20)
GFR: 86.79 mL/min (ref >60.00)
Glucose, Bld: 87 mg/dL (ref 70–99)
Potassium: 4.4 meq/L (ref 3.5–5.1)
Sodium: 138 meq/L (ref 135–145)
Total Bilirubin: 0.6 mg/dL (ref 0.2–1.2)
Total Protein: 6.7 g/dL (ref 6.0–8.3)

## 2024-04-08 LAB — HEMOGLOBIN A1C: Hgb A1c MFr Bld: 5.5 % (ref 4.6–6.5)

## 2024-04-08 LAB — LIPID PANEL
Cholesterol: 175 mg/dL (ref 0–200)
HDL: 44.2 mg/dL (ref >39.00)
LDL Cholesterol: 106 mg/dL — ABNORMAL HIGH (ref 0–99)
NonHDL: 130.5
Total CHOL/HDL Ratio: 4
Triglycerides: 122 mg/dL (ref 0.0–149.0)
VLDL: 24.4 mg/dL (ref 0.0–40.0)

## 2024-04-08 LAB — VITAMIN B12: Vitamin B-12: 693 pg/mL (ref 211–911)

## 2024-04-08 LAB — TSH: TSH: 2.43 u[IU]/mL (ref 0.35–5.50)

## 2024-04-08 MED ORDER — MONTELUKAST SODIUM 10 MG PO TABS: ORAL_TABLET | ORAL | 3 refills | Status: AC

## 2024-04-08 MED ORDER — ALBUTEROL SULFATE HFA 108 (90 BASE) MCG/ACT IN AERS: 2.0000 | INHALATION_SPRAY | RESPIRATORY_TRACT | 1 refills | Status: AC | PRN

## 2024-04-08 MED ORDER — FLUTICASONE PROPIONATE 50 MCG/ACT NA SUSP: 1.0000 | Freq: Every day | NASAL | 3 refills | Status: AC

## 2024-04-08 MED ORDER — CETIRIZINE HCL 10 MG PO TABS: 10.0000 mg | ORAL_TABLET | Freq: Every day | ORAL | 3 refills | Status: AC

## 2024-04-08 MED ORDER — TRIAMCINOLONE ACETONIDE 0.5 % EX CREA: 1.0000 | TOPICAL_CREAM | Freq: Three times a day (TID) | CUTANEOUS | 3 refills | Status: AC

## 2024-04-08 MED ORDER — BUDESONIDE-FORMOTEROL FUMARATE 80-4.5 MCG/ACT IN AERO: 2.0000 | INHALATION_SPRAY | Freq: Two times a day (BID) | RESPIRATORY_TRACT | 3 refills | Status: AC

## 2024-04-08 MED ORDER — KETOCONAZOLE 2 % EX CREA
1.0000 | TOPICAL_CREAM | Freq: Two times a day (BID) | CUTANEOUS | 0 refills | Status: AC
Start: 2024-04-08 — End: ?

## 2024-04-08 MED ORDER — AZELASTINE HCL 0.1 % NA SOLN
2.0000 | Freq: Two times a day (BID) | NASAL | 3 refills | Status: AC
Start: 2024-04-08 — End: ?

## 2024-04-08 NOTE — Progress Notes (Signed)
 Chief Complaint:  Cindy Pittman is a 48 y.o. female who presents today for her annual comprehensive physical exam.    Assessment/Plan:  Chronic Problems Addressed Today: Exercise induced bronchospasm With allergist and she is on Singulair , Zyrtec, and albuterol .  Will give pneumonia vaccine today. Will refill her albuterol  and Symbicort  today.   Sleep-disordered breathing Patient is concerned about OSA.  She has poor sleep quality, daytime fatigue, and occasionally snores.  Also has a family history of OSA.  Will place referral for sleep study today.  Dyslipidemia Discussed lifestyle modifications.  Check lipids.  Allergic rhinitis Follows with allergy.  She is currently on Flonase , Astelin , Singulair , and Zyrtec.  Thyroid  nodule She is followed with endocrinology for this and last saw them a few years ago.  Biopsy at that time was reassuring.  She has noticed more pain to the right side of her neck and would like to have the ultrasound repeated.  Will order this today and check TSH.   Preventative Healthcare: Check labs.  Pneumonia and flu vaccine given today.  Will refer for colonoscopy-Cologuard was 3 years ago but she would like to have a colonoscopy now.  Follows with gynecology for women's health - will obtain records.   Patient Counseling(The following topics were reviewed and/or handout was given):  -Nutrition: Stressed importance of moderation in sodium/caffeine intake, saturated fat and cholesterol, caloric balance, sufficient intake of fresh fruits, vegetables, and fiber.  -Stressed the importance of regular exercise.   -Substance Abuse: Discussed cessation/primary prevention of tobacco, alcohol, or other drug use; driving or other dangerous activities under the influence; availability of treatment for abuse.   -Injury prevention: Discussed safety belts, safety helmets, smoke detector, smoking near bedding or upholstery.   -Sexuality: Discussed sexually transmitted  diseases, partner selection, use of condoms, avoidance of unintended pregnancy and contraceptive alternatives.   -Dental health: Discussed importance of regular tooth brushing, flossing, and dental visits.  -Health maintenance and immunizations reviewed. Please refer to Health maintenance section.  Return to care in 1 year for next preventative visit.     Subjective:  HPI:  She has no acute complaints today. Patient is here today for her annual physical.  See assessment / plan for status of chronic conditions.  Discussed the use of AI scribe software for clinical note transcription with the patient, who gave verbal consent to proceed.  History of Present Illness Cindy Pittman is a 48 year old female who presents for an annual physical exam.  She is interested in transitioning from Cologuard to a colonoscopy for colorectal cancer screening this year.  She has concerns about her sleep quality despite undergoing nasal surgery in the spring, which improved her breathing. Her [employer] device shows dips in her pulse oximetry at night. She sometimes wakes herself up with snoring and feels unrefreshed upon waking. She experiences significant fatigue in the afternoons, particularly around 3 PM, which has improved somewhat since her nasal surgery. Her father had heart failure due to untreated sleep apnea, raising her concern about potential sleep apnea.  She has a history of a thyroid  nodule that was biopsied a few years ago. Recently, she has experienced more significant pain from the nodule, which she describes as 'hurts a little more.' Her last thyroid  ultrasound was approximately three years ago, after which she was released from care.  She has been seeing an allergist and reports mild allergy symptoms in the fall, but no significant sinus issues. She is scheduled for a repeat allergy  skin test next Tuesday.  She experienced a period of extreme fatigue a few weeks ago, requiring her to take a  couple of days off work. She describes feeling exhausted without typical cold or flu symptoms, and she had to sleep for a few days. She attributes this to possibly fighting off an illness.  She has been actively working on her diet and exercise, participating in a women's weightlifting class and running. She has lost about seven to eight pounds recently and is not eating out as much. She reports being able to do thirty pushups and is working towards further weight loss.      04/08/2024    8:10 AM  Depression screen PHQ 2/9  Decreased Interest 0  Down, Depressed, Hopeless 0  PHQ - 2 Score 0    Health Maintenance Due  Topic Date Due   Hepatitis B Vaccines 19-59 Average Risk (1 of 3 - 19+ 3-dose series) Never done   Colonoscopy  Never done   Cervical Cancer Screening (HPV/Pap Cotest)  12/06/2021   Influenza Vaccine  01/12/2024     ROS: Per HPI, otherwise a complete review of systems was negative.   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   Chronic rhinitis 08/08/2023   Epistaxis 08/08/2023   Chronic pansinusitis 06/29/2023   Hypertrophy of nasal turbinates 06/29/2023   Adjustment disorder 04/06/2023   Dermatitis 01/07/2022   Thyroid  nodule 12/29/2020   Overweight 12/23/2019   Allergic rhinitis 12/11/2018   Dyslipidemia 10/12/2018   History of nephrolithiasis 10/12/2018   Exercise induced bronchospasm 06/30/2008   PCOS (polycystic ovarian syndrome) 07/18/2006   Past Surgical History:  Procedure Laterality Date   Bilateral turbinate reduction, concha bullosa Bilateral 10/19/2023   Labial Cystectomy     LUMBAR DISC SURGERY  06/13/1988   L4, Dr Carles   Otic Tubes     SEPTOPLASTY     WISDOM  TOOTH EXTRACTION      Family History  Problem Relation Age of Onset   Heart attack Mother 24       Smoker   Diabetes Mother    Breast cancer Mother    Diabetes Father    Gout Father    Heart attack Maternal Grandfather 55   Seizures Paternal Grandfather    Diabetes Cousin    Stroke Neg Hx     Medications- reviewed and updated Current Outpatient Medications  Medication Sig Dispense Refill   Cholecalciferol (VITAMIN D -3) 25 MCG (1000 UT) CAPS Take by mouth.     Levonorgestrel-Ethinyl Estradiol (AMETHIA) 0.1-0.02 & 0.01 MG tablet      albuterol  (VENTOLIN  HFA) 108 (90 Base) MCG/ACT inhaler Inhale 2 puffs into the lungs every 4 (four) hours as needed for wheezing or shortness of breath. 36 g 1   azelastine  (ASTELIN ) 0.1 % nasal spray Place 2 sprays into both nostrils 2 (two) times daily. 90 mL 3   budesonide -formoterol  (SYMBICORT ) 80-4.5 MCG/ACT inhaler Inhale 2 puffs into the lungs in the morning and at bedtime. TAKE 2 PUFFS BY MOUTH TWICE A DAY 30.6 g 3   cetirizine (ZYRTEC) 10 MG tablet Take 1 tablet (10 mg total) by mouth daily. 90 tablet 3   fluticasone  (  FLONASE ) 50 MCG/ACT nasal spray Place 1 spray into both nostrils daily. 48 g 3   ketoconazole  (NIZORAL ) 2 % cream Apply 1 Application topically 2 (two) times daily. 60 g 0   montelukast  (SINGULAIR ) 10 MG tablet TAKE 1 TABLET BY MOUTH EVERYDAY AT BEDTIME 90 tablet 3   triamcinolone  cream (KENALOG ) 0.5 % Apply 1 Application topically 3 (three) times daily. 30 g 3   No current facility-administered medications for this visit.    Allergies-reviewed and updated Allergies  Allergen Reactions   Sulfonamide Derivatives     Rash as child   Asparagus     Social History   Socioeconomic History   Marital status: Single    Spouse name: Not on file   Number of children: Not on file   Years of education: Not on file   Highest education level: Not on file  Occupational History   Not on file  Tobacco Use   Smoking status: Never    Smokeless tobacco: Never  Vaping Use   Vaping status: Never Used  Substance and Sexual Activity   Alcohol use: Yes    Comment: Rarely   Drug use: No   Sexual activity: Not Currently  Other Topics Concern   Not on file  Social History Narrative   Not on file   Social Drivers of Health   Financial Resource Strain: Not on file  Food Insecurity: Not on file  Transportation Needs: Not on file  Physical Activity: Not on file  Stress: Not on file  Social Connections: Not on file        Objective:  Physical Exam: BP 136/82   Pulse (!) 51   Temp 98.2 F (36.8 C) (Temporal)   Resp 14   Ht 5' 7 (1.702 m)   Wt 186 lb 6.4 oz (84.6 kg)   SpO2 99%   BMI 29.19 kg/m   Body mass index is 29.19 kg/m. Wt Readings from Last 3 Encounters:  04/08/24 186 lb 6.4 oz (84.6 kg)  09/13/23 189 lb 3.2 oz (85.8 kg)  08/22/23 180 lb (81.6 kg)   Gen: NAD, resting comfortably HEENT: TMs normal bilaterally. OP clear. No thyromegaly noted.  CV: RRR with no murmurs appreciated Pulm: NWOB, CTAB with no crackles, wheezes, or rhonchi GI: Normal bowel sounds present. Soft, Nontender, Nondistended. MSK: no edema, cyanosis, or clubbing noted Skin: warm, dry Neuro: CN2-12 grossly intact. Strength 5/5 in upper and lower extremities. Reflexes symmetric and intact bilaterally.  Psych: Normal affect and thought content     Lodema Parma M. Kennyth, MD 04/08/2024 8:44 AM

## 2024-04-08 NOTE — Progress Notes (Signed)
 Her LDL is a little bit elevated but this is better than last year.  The rest of her labs are all at goal.  Do not need to make any changes to her treatment plan at this time.  She should continue to work on diet and exercise and we can recheck everything in a year.

## 2024-04-08 NOTE — Assessment & Plan Note (Signed)
 Patient is concerned about OSA.  She has poor sleep quality, daytime fatigue, and occasionally snores.  Also has a family history of OSA.  Will place referral for sleep study today.

## 2024-04-08 NOTE — Assessment & Plan Note (Signed)
 She is followed with endocrinology for this and last saw them a few years ago.  Biopsy at that time was reassuring.  She has noticed more pain to the right side of her neck and would like to have the ultrasound repeated.  Will order this today and check TSH.

## 2024-04-08 NOTE — Assessment & Plan Note (Signed)
 Follows with allergy.  She is currently on Flonase , Astelin , Singulair , and Zyrtec.

## 2024-04-08 NOTE — Assessment & Plan Note (Signed)
Discussed lifestyle modifications. Check lipids.  ?

## 2024-04-08 NOTE — Patient Instructions (Addendum)
 It was very nice to see you today!  Follows with VISIT SUMMARY: During your annual physical exam, we discussed your concerns about sleep quality, thyroid  nodule pain, and allergy symptoms. We also reviewed your recent weight loss and exercise efforts. We administered vaccines and ordered several blood tests. Referrals were provided for a colonoscopy and a sleep study.  YOUR PLAN: ADULT WELLNESS VISIT: Routine wellness visit focusing on diet, exercise, and weight management. -Continue with your current diet and exercise routine. -Administered influenza and pneumococcal vaccines. -Ordered blood work including A1c, CBC, lipid panel, renal and hepatic function, electrolytes, TSH, vitamin D , and B12. -Request records for recent Pap smear and mammogram. -Provided referral for colonoscopy.  THYROID  NODULE WITH INCREASED PAIN: Increased pain from a thyroid  nodule, last ultrasound was three years ago. -[facility].  SUSPECTED SLEEP-DISORDERED BREATHING: Poor sleep quality, occasional snoring, and daytime fatigue with a family history of heart failure due to untreated sleep apnea. -Provided referral for a sleep study.  ELEVATED BLOOD PRESSURE (NOT MEETING HYPERTENSION CRITERIA): Occasional elevated blood pressure readings, not persistently above 140/90. -Monitor your blood pressure at home for accurate readings.  ALLERGIC RHINITIS: Mild allergy symptoms in the fall, managed by an allergist. -Refilled current allergy medications: Flonase , Astelin , Singulair , Zyrtec, Albuterol .  Return in about 1 year (around 04/08/2025) for Annual Physical.   Take care, Dr Kennyth  PLEASE NOTE:  If you had any lab tests, please let us  know if you have not heard back within a few days. You may see your results on mychart before we have a chance to review them but we will give you a call once they are reviewed by us .   If we ordered any referrals today, please let us  know  if you have not heard from their office within the next week.   If you had any urgent prescriptions sent in today, please check with the pharmacy within an hour of our visit to make sure the prescription was transmitted appropriately.   Please try these tips to maintain a healthy lifestyle:  Eat at least 3 REAL meals and 1-2 snacks per day.  Aim for no more than 5 hours between eating.  If you eat breakfast, please do so within one hour of getting up.   Each meal should contain half fruits/vegetables, one quarter protein, and one quarter carbs (no bigger than a computer mouse)  Cut down on sweet beverages. This includes juice, soda, and sweet tea.   Drink at least 1 glass of water with each meal and aim for at least 8 glasses per day  Exercise at least 150 minutes every week.     Preventive Care 67-76 Years Old, Female Preventive care refers to lifestyle choices and visits with your health care provider that can promote health and wellness. Preventive care visits are also called wellness exams. What can I expect for my preventive care visit? Counseling Your health care provider may ask you questions about your: Medical history, including: Past medical problems. Family medical history. Pregnancy history. Current health, including: Menstrual cycle. Method of birth control. Emotional well-being. Home life and relationship well-being. Sexual activity and sexual health. Lifestyle, including: Alcohol, nicotine or tobacco, and drug use. Access to firearms. Diet, exercise, and sleep habits. Work and work astronomer. Sunscreen use. Safety issues such as seatbelt and bike helmet use. Physical exam Your health care provider will check your: Height and weight. These may be used to calculate your BMI (body mass index). BMI  is a measurement that tells if you are at a healthy weight. Waist circumference. This measures the distance around your waistline. This measurement also tells if you  are at a healthy weight and may help predict your risk of certain diseases, such as type 2 diabetes and high blood pressure. Heart rate and blood pressure. Body temperature. Skin for abnormal spots. What immunizations do I need?  Vaccines are usually given at various ages, according to a schedule. Your health care provider will recommend vaccines for you based on your age, medical history, and lifestyle or other factors, such as travel or where you work. What tests do I need? Screening Your health care provider may recommend screening tests for certain conditions. This may include: Lipid and cholesterol levels. Diabetes screening. This is done by checking your blood sugar (glucose) after you have not eaten for a while (fasting). Pelvic exam and Pap test. Hepatitis B test. Hepatitis C test. HIV (human immunodeficiency virus) test. STI (sexually transmitted infection) testing, if you are at risk. Lung cancer screening. Colorectal cancer screening. Mammogram. Talk with your health care provider about when you should start having regular mammograms. This may depend on whether you have a family history of breast cancer. BRCA-related cancer screening. This may be done if you have a family history of breast, ovarian, tubal, or peritoneal cancers. Bone density scan. This is done to screen for osteoporosis. Talk with your health care provider about your test results, treatment options, and if necessary, the need for more tests. Follow these instructions at home: Eating and drinking  Eat a diet that includes fresh fruits and vegetables, whole grains, lean protein, and low-fat dairy products. Take vitamin and mineral supplements as recommended by your health care provider. Do not drink alcohol if: Your health care provider tells you not to drink. You are pregnant, may be pregnant, or are planning to become pregnant. If you drink alcohol: Limit how much you have to 0-1 drink a day. Know how much  alcohol is in your drink. In the [place] one drink equals one 12 oz bottle of beer (355 mL), one 5 oz glass of wine (148 mL), or one 1 oz glass of hard liquor (44 mL). Lifestyle Brush your teeth every morning and night with fluoride toothpaste. Floss one time each day. Exercise for at least 30 minutes 5 or more days each week. Do not use any products that contain nicotine or tobacco. These products include cigarettes, chewing tobacco, and vaping devices, such as e-cigarettes. If you need help quitting, ask your health care provider. Do not use drugs. If you are sexually active, practice safe sex. Use a condom or other form of protection to prevent STIs. If you do not wish to become pregnant, use a form of birth control. If you plan to become pregnant, see your health care provider for a prepregnancy visit. Take aspirin only as told by your health care provider. Make sure that you understand how much to take and what form to take. Work with your health care provider to find out whether it is safe and beneficial for you to take aspirin daily. Find healthy ways to manage stress, such as: Meditation, yoga, or listening to music. Journaling. Talking to a trusted person. Spending time with friends and family. Minimize exposure to UV radiation to reduce your risk of skin cancer. Safety Always wear your seat belt while driving or riding in a vehicle. Do not drive: If you have been drinking alcohol. Do not ride with  someone who has been drinking. When you are tired or distracted. While texting. If you have been using any mind-altering substances or drugs. Wear a helmet and other protective equipment during sports activities. If you have firearms in your house, make sure you follow all gun safety procedures. Seek help if you have been physically or sexually abused. What's next? Visit your health care provider once a year for an annual wellness visit. Ask your health care provider how often you should  have your eyes and teeth checked. Stay up to date on all vaccines. This information is not intended to replace advice given to you by your health care provider. Make sure you discuss any questions you have with your health care provider. Document Revised: 11/25/2020 Document Reviewed: 11/25/2020 Elsevier Patient Education  2024 [employer].

## 2024-04-08 NOTE — Assessment & Plan Note (Addendum)
 With allergist and she is on Singulair , Zyrtec, and albuterol .  Will give pneumonia vaccine today. Will refill her albuterol  and Symbicort  today.

## 2024-04-23 ENCOUNTER — Ambulatory Visit (HOSPITAL_BASED_OUTPATIENT_CLINIC_OR_DEPARTMENT_OTHER)
Admission: RE | Admit: 2024-04-23 | Discharge: 2024-04-23 | Disposition: A | Discharge status: Home / Self Care | Source: Ambulatory Visit | Attending: Family Medicine | Admitting: Family Medicine

## 2024-04-23 DIAGNOSIS — E041 Nontoxic single thyroid nodule: Secondary | ICD-10-CM | POA: Diagnosis present

## 2024-05-02 NOTE — Telephone Encounter (Signed)
 I sent [facility] information over to patient via MyChart as she expressed she has yet to hear from them about doing her colonoscopy.

## 2024-05-02 NOTE — Progress Notes (Signed)
 Her thyroid  nodule has increased in size since the last time they performed an ultrasound.  Radiology is recommending a fine needle aspiration.  I believe Dr. Eletha did this for her last time. We can refer her back there for this or we can refer her to endocrinology.

## 2024-05-07 ENCOUNTER — Encounter: Payer: Self-pay | Admitting: Neurology

## 2024-05-07 ENCOUNTER — Encounter: Payer: Self-pay | Admitting: Pediatrics

## 2024-05-07 ENCOUNTER — Ambulatory Visit: Admitting: Neurology

## 2024-05-07 VITALS — BP 132/82 | HR 89 | Ht 67.0 in | Wt 183.0 lb

## 2024-05-07 DIAGNOSIS — Z820 Family history of epilepsy and other diseases of the nervous system: Secondary | ICD-10-CM | POA: Diagnosis not present

## 2024-05-07 DIAGNOSIS — G4719 Other hypersomnia: Secondary | ICD-10-CM | POA: Insufficient documentation

## 2024-05-07 DIAGNOSIS — G4710 Hypersomnia, unspecified: Secondary | ICD-10-CM | POA: Diagnosis not present

## 2024-05-07 NOTE — Progress Notes (Signed)
 @[facility]   Provider:  Dedra Gores, MD  Primary Care Physician:  Kennyth Worth HERO, MD [address] [place] [address]  Referring Provider: Kennyth Worth HERO, Md [address] [place],  [place] [address]        Chief Concern for this Consultation:   Patient presents with     Pt is well, reports poor sleep quality, daytime fatigue and has woken herself up snoring. Does have family history of OSA. Gets about 7 hrs of sleep a night. Has more trouble with staying asleep.      HPI: I have the pleasure of meeting with Cindy Pittman , on 05/07/24 , who is a 48 y.o.  female patient,  seen upon a referral by PCP  for a  Sleep Medicine Consultation.  The patient's referral information asked for a daytime sleepiness evaluation.   Sleep relevant medical/ surgical and symptom history: The patient reports onset of symptoms over a time period of 12- 24 months.   Sleeping through a movie in a cinema has been happening even 10 years ago.  Chief concern according to patient:  after I started no longer working from home over the last year, I have noted how much more sleepy I am when I drive to work, especially when I return at 3 PM for a  30 minute commute. At home, I easily fall asleep. I have seasonal allergies.     Cindy Pittman    has a past medical history of History of ear infections and Hyperlipidemia. Seasonal allergies,  sinus congestion, hay fever in spring,  Otitis media even in adulthood,    ENT surgery or problems: (, deviated septum repair, Sinusitis, turbinate reduction 5/ 25 ),  Mood disorders: had some anxiety under high stress  thyroid  nodule.  The patient had no previous sleep evaluations.   Family medical history: Father has CHF after untreated OSA, There are  biological family members affected by Sleep apnea ( father). 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  The patient currently works daytime.   Nicotine use: /.  ETOH use: / ,  Caffeine intake in form of: Coffee (/), Soft drinks (Dr. Nunzio - 5-6 cans a day ), Tea ( /) or Energy drinks ( including those containing  taurine ). Caffeine is last consumed at PM.  Exercises regularly in form of running, weights, tennis .  Hobbies ; see above .      Sleep habits and routines are as follows: The patient's dinner time is around 5.30- 6.30  PM.   She often naps already when she comes to rest,  when not physically active.  . The patient goes to bed at, or close to, 9.30 PM. She watches TV on a timer 30 minutes.  The bedroom is  described as cool, quiet, and dark.  The patient reports that it takes 10 minutes to fall asleep, then continues to sleep for 7 hours, uninterrupted or woken up by , by the need to void (Nocturia).   The preferred sleep position is laterally, with support of 1 pillows, (non- adjustable bed/ flat  The total estimated sleep time is circa 7 hours.  Dreams are reportedly rare/ frequent/ and can  be vivid.  Dream enactment has rarely been reported.   5.30 - 5. 45  AM is the usual week- day rise time. The patient wakes up spontaneously at 5. 30. AM   Cindy Pittman  reports not feeling refreshed and restored in the morning, waking with symptoms such as dry mouth, sometimes morning headaches, stiffness of the neck, shoulder. and fatigue.   No sleep paralysis has been experienced.  Naps in daytime are taken frequently (there is a desire to nap and opportunity), lasting from 20 to 30 and have a somewhat refreshing quality.  These do not interfere with nocturnal sleep.    Review of Systems: Out of a complete 14 system review, the patient complains of only the following symptoms, and all other reviewed systems are negative.:  Hypersomnia, EDS _    Snoring, Sleep fragmentation, no  Nocturia  Sometimes vivid dreams,  No  sleep paralysis  No cataplexy.    How likely are you to doze in the following situations: 0 = not likely, 1 = slight chance, 2 = moderate chance, 3 = high chance Sitting and Reading? Watching Television? Sitting inactive in a public place (theater or meeting)? As a passenger in a car for an hour without a break? Lying down in the afternoon when circumstances permit? Sitting and talking to someone? Sitting quietly after lunch without alcohol? In a car, while stopped for a few minutes in traffic?   Total ESS =17 / 24 points.    FSS endorsed at 45/ 63 points.  GDS:  Social History   Socioeconomic History   Marital status: Single    Spouse name: Not on file   Number of children: Not on file   Years of education: Not on file   Highest education level: Not on file  Occupational History   Not on file  Tobacco Use   Smoking status: Never   Smokeless tobacco: Never  Vaping Use   Vaping status: Never Used  Substance and Sexual Activity   Alcohol use: Yes    Comment: Rarely   Drug use: No   Sexual activity: Not Currently  Other Topics Concern   Not on file  Social History Narrative   Not on file   Social Drivers of Health   Financial Resource Strain: Not on file  Food Insecurity: Not on file  Transportation Needs: Not on file  Physical Activity: Not on file  Stress: Not on file  Social Connections: Not on file    Family History  Problem Relation Age of Onset   Heart attack Mother 71       Smoker   Diabetes Mother    Breast cancer Mother    Diabetes Father    Gout Father    Heart attack Maternal Grandfather 44   Seizures Paternal Grandfather    Diabetes Cousin    Stroke Neg Hx     Past Medical History:  Diagnosis Date   History of ear infections    Hyperlipidemia     Ideal LDL goal = < 70 based on NMR Lipoprofile    Past Surgical History:  Procedure Laterality Date   Bilateral turbinate reduction, concha bullosa Bilateral 10/19/2023   Labial Cystectomy      LUMBAR DISC SURGERY  06/13/1988   L4, Dr Carles   Otic Tubes     SEPTOPLASTY     WISDOM TOOTH EXTRACTION       Current Outpatient Medications on File Prior to Visit  Medication Sig Dispense Refill  albuterol  (VENTOLIN  HFA) 108 (90 Base) MCG/ACT inhaler Inhale 2 puffs into the lungs every 4 (four) hours as needed for wheezing or shortness of breath. 36 g 1   azelastine  (ASTELIN ) 0.1 % nasal spray Place 2 sprays into both nostrils 2 (two) times daily. 90 mL 3   budesonide -formoterol  (SYMBICORT ) 80-4.5 MCG/ACT inhaler Inhale 2 puffs into the lungs in the morning and at bedtime. TAKE 2 PUFFS BY MOUTH TWICE A DAY 30.6 g 3   cetirizine  (ZYRTEC ) 10 MG tablet Take 1 tablet (10 mg total) by mouth daily. 90 tablet 3   Cholecalciferol (VITAMIN D -3) 25 MCG (1000 UT) CAPS Take by mouth.     fluticasone  (FLONASE ) 50 MCG/ACT nasal spray Place 1 spray into both nostrils daily. 48 g 3   ketoconazole  (NIZORAL ) 2 % cream Apply 1 Application topically 2 (two) times daily. 60 g 0   Levonorgestrel-Ethinyl Estradiol (AMETHIA) 0.1-0.02 & 0.01 MG tablet      montelukast  (SINGULAIR ) 10 MG tablet TAKE 1 TABLET BY MOUTH EVERYDAY AT BEDTIME 90 tablet 3   triamcinolone  cream (KENALOG ) 0.5 % Apply 1 Application topically 3 (three) times daily. 30 g 3   No current facility-administered medications on file prior to visit.    Allergies  Allergen Reactions   Sulfonamide Derivatives     Rash as child   Asparagus     Vitals:   05/07/24 0902  BP: 132/82  Pulse: 89    Physical exam:   General: The patient was alert and appears not in acute distress.  Mood and affect are appropriate .  The patient's interactions are: Cooperative, makes eye contact, follows the instructions and answers questions coherently.  The patient is groomed and appropriately groomed and dressed. Head: Normocephalic, atraumatic.  Neck is supple. Mallampati: 2.  The neck circumference measured 14 inches. Nasal airflow was not fully  patent ,  status post deviated septum nasi repair.  Overbite / Retrognathia was noted.  Dental status: biological  Cardiovascular:  Regular rate and cardiac rhythm by palpable pulse. Respiratory: no audible wheezing, no tachypnoea.   Skin:  Without evidence of ankle edema. No discoloration.  Trunk:  BMI is 28. 66  The patient's posture was erect.   Neurologic exam : The patient was awake and alert, oriented to place and time.   Attention span & concentration ability appeared normal.  Speech was fluent, without dysarthria, dysphonia or aphasia, and of normal volume.     Cranial nerves:  There was no loss of smell or taste reported  Pupils are round, equal in size and briskly reactive to light.  Funduscopic exam was deferred.  Extraocular movements in vertical and horizontal planes were intact and without nystagmus. (No Diplopia reported). Visual fields by finger perimetry are intact. Hearing was intact to soft voice.    Facial sensation intact to fine touch.  Facial motor strength: Symmetric movement and tongue and uvula move midline.  Neck ROM: rotation, tilt and flexion extension were intact for age and shoulder shrug was symmetrical.    Motor exam:  Symmetric bulk, strength and ROM.   Normal tone without cog- wheeling, and symmetric grip strength.   Sensory:  Fine touch and vibration were tested by tuning fork and intact.  Proprioception tested in the upper extremities was normal.   Coordination: The patient reported no problems with button closure and no changes to penmanship.   The Finger-to-nose maneuver was intact without evidence of ataxia, dysmetria or tremor.   Gait and station: Patient could  rise unassisted from a seated position, without bracing,  and walked without assistive device.    Deep tendon reflexes: Upper extremities did show symmetric DTRs. Lower extremity DTRs were symmetric and brisk/ attenuated.   Babinski response was deferred.    I would like to  thank  Kennyth Worth HERO, Md [address] [place],  [place] [address] for allowing me to meet with his patient.     In short, Cindy Pittman is presenting with excessive daytime sleepiness that came to light as she changed job locations and now has a 30 minute commute, but the sleepiness is present for 10 years or more.   Her father had OSA and left it untreated, developed CHF. ,  Risk factors for OSA were present,  including : Body mass index is 28.66 kg/m.,  normal neck size and unremarkable upper airway anatomy.   My Plan is to proceed with:  HST/ PSG/ NARCOLEPSY PANEL/  May use  Modafinil for driving safety if OSA or idiopathic hypersomnia  are diagnosed.     I plan to follow up personally within the next  4-6 months .  A total time of  45  minutes consistent of a part of face to face encounter , exam and interview,  and additional preparation time for chart review was spent .  At today's visit, we discussed treatment options, associated risk and benefits, and engage in counseling as needed including, but not limited to:  Sleep hygiene, Quality Sleep Habits, and Safety concerns for patients with daytime sleepiness who are warned to not operate machinery/ motor vehicles when drowsy.  Additionally, the following were reviewed: Past medical records, past medical and surgical history, family and social background, as well as relevant laboratory results, imaging findings, and medical notes, where applicable.  This note was generated by myself in part by using dictation software, and as a result, it may contain unintentional typos and errors.  Nevertheless, effort was made to accurately convey the pertinent aspects of the patient's visit.   Dedra Gores, MD  [facility] and [facility] Sleep Board certified in Sleep Medicine by The [employer] of Sleep Medicine and Diplomate of the [employer] (AASM) . Board certified In Neurology, Diplomat of the ABPN,   Fellow of the [employer].

## 2024-05-07 NOTE — Patient Instructions (Signed)
  In short, Mrs Garramone is presenting with excessive daytime sleepiness that came to light as she changed job locations and now has a 30 minute commute, but the sleepiness is present for 10 years or more.   Her father had OSA and left it untreated, developed CHF. ,  Risk factors for OSA were present,  including : Body mass index is 28.66 kg/m.,  normal neck size and unremarkable upper airway anatomy.   My Plan is to proceed with:  HST/ PSG/ NARCOLEPSY PANEL/  May use  Modafinil for driving safety if OSA or idiopathic hypersomnia  are diagnosed.     I plan to follow up personally within the next  4-6 months .  A total time of  45  minutes consistent of a part of face to face encounter , exam and interview,  and additional preparation time for chart review was spent .  At today's visit, we discussed treatment options, associated risk and benefits, and engage in counseling as needed including, but not limited to:  Sleep hygiene, Quality Sleep Habits, and Safety concerns for patients with daytime sleepiness who are warned to not operate machinery/ motor vehicles when drowsy.  Additionally, the following were reviewed: Past medical records, past medical and surgical history, family and social background, as well as relevant laboratory results, imaging findings, and medical notes, where applicable.  This note was generated by myself in part by using dictation software, and as a result, it may contain unintentional typos and errors.  Nevertheless, effort was made to accurately convey the pertinent aspects of the patient's visit.

## 2024-05-16 ENCOUNTER — Ambulatory Visit: Payer: Self-pay | Admitting: Neurology

## 2024-05-16 LAB — NARCOLEPSY EVALUATION
DQA1*01:02: POSITIVE
DQB1*06:02: POSITIVE

## 2024-05-16 NOTE — Telephone Encounter (Signed)
 Can we please refer patient to Dr. Ronold office for her tyroid please. She was to be referred out last month and has yet to hear from anyone. Can you please assist. Please and thank you.

## 2024-05-17 ENCOUNTER — Ambulatory Visit: Admitting: Neurology

## 2024-05-17 DIAGNOSIS — G47419 Narcolepsy without cataplexy: Secondary | ICD-10-CM

## 2024-05-17 DIAGNOSIS — G4710 Hypersomnia, unspecified: Secondary | ICD-10-CM | POA: Diagnosis not present

## 2024-05-17 DIAGNOSIS — Z820 Family history of epilepsy and other diseases of the nervous system: Secondary | ICD-10-CM

## 2024-05-17 DIAGNOSIS — G4719 Other hypersomnia: Secondary | ICD-10-CM

## 2024-05-20 ENCOUNTER — Ambulatory Visit (INDEPENDENT_AMBULATORY_CARE_PROVIDER_SITE_OTHER): Admitting: Otolaryngology

## 2024-05-21 NOTE — Addendum Note (Signed)
 Addended by: FRANCIS ROULEAU A on: 05/21/2024 04:21 PM   Modules accepted: Orders

## 2024-05-23 ENCOUNTER — Encounter (INDEPENDENT_AMBULATORY_CARE_PROVIDER_SITE_OTHER): Payer: Self-pay | Admitting: Otolaryngology

## 2024-05-23 ENCOUNTER — Ambulatory Visit (INDEPENDENT_AMBULATORY_CARE_PROVIDER_SITE_OTHER): Admitting: Otolaryngology

## 2024-05-23 VITALS — BP 115/75 | HR 60 | Ht 66.5 in | Wt 184.0 lb

## 2024-05-23 DIAGNOSIS — R040 Epistaxis: Secondary | ICD-10-CM

## 2024-05-23 DIAGNOSIS — J343 Hypertrophy of nasal turbinates: Secondary | ICD-10-CM

## 2024-05-23 DIAGNOSIS — R0981 Nasal congestion: Secondary | ICD-10-CM | POA: Diagnosis not present

## 2024-05-23 DIAGNOSIS — J310 Chronic rhinitis: Secondary | ICD-10-CM

## 2024-05-23 NOTE — Progress Notes (Signed)
 Patient ID: Cindy Pittman, female   DOB: [birth year], 48 y.o.   MRN: [id number]  Follow up: Chronic nasal congestion, recurrent epistaxis  History of Present Illness Cindy Pittman is a 48 year old female who returns today for her follow-up evaluation.  Over the past three weeks, she has experienced mild upper respiratory symptoms, which were less severe than her typical winter illnesses. She has not had any severe epistaxis, but notes minor bleeding, particularly with nose blowing. She has increased use of AYR nasal gel and utilizes a humidifier at home and work to address nasal dryness. She denies facial pain or tenderness.  She states she can breathe much better since her turbinate reduction surgery. Even during her recent cold, she has not experienced significant nasal obstruction.  She continues to use allergy medications and has not had a significant sinus infection this fall, which is an improvement from previous years. She occasionally performs nasal irrigation, especially after increased outdoor exposure, and expresses satisfaction with her recovery, seeking reassurance regarding proper healing.  Exam: General: Communicates without difficulty, well nourished, no acute distress. Head: Normocephalic, no evidence injury, no tenderness, facial buttresses intact without stepoff. Face/sinus: No tenderness to palpation and percussion. Facial movement is normal and symmetric. Eyes: PERRL, EOMI. No scleral icterus, conjunctivae clear. Neuro: CN II exam reveals vision grossly intact.  No nystagmus at any point of gaze. Ears: Auricles well formed without lesions.  Ear canals are intact without mass or lesion.  No erythema or edema is appreciated.  The TMs are intact without fluid. Nose: External evaluation reveals normal support and skin without lesions.  Dorsum is intact.  Anterior rhinoscopy reveals congested mucosa over anterior aspect of inferior turbinates and intact septum.  No purulence noted.  Oral:  Oral cavity and oropharynx are intact, symmetric, without erythema or edema.  Mucosa is moist without lesions. Neck: Full range of motion without pain.  There is no significant lymphadenopathy.  No masses palpable.  Thyroid  bed within normal limits to palpation.  Parotid glands and submandibular glands equal bilaterally without mass.  Trachea is midline. Neuro:  CN 2-12 grossly intact.    Assessment and Plan Assessment & Plan Chronic nasal congestion 6 months post nasal turbinate reduction surgery, she demonstrates complete healing with significant improvement in nasal airflow and no evidence of obstruction. Turbinates are healing well, and she tolerates mild upper respiratory symptoms without significant nasal obstruction. Allergic symptoms persist, but overall nasal function is markedly improved. - Reinforced continued use of allergy medications for persistent symptoms. - Recommended ongoing nasal irrigation as needed, particularly after allergen exposure. - Advised return for evaluation if new symptoms such as sinus infection or worsening nasal obstruction develop.  Epistaxis Epistaxis is limited to minor bleeding with nose blowing, without severe or prolonged episodes. Preventive measures with AYR nasal spray and humidifier are appropriate, especially during winter months. - Discussed continued use of AYR nasal spray and humidifier to prevent nasal dryness and reduce risk of epistaxis. - Advised use of humidifier while heater is in use at home and work. - Instructed her to report any increase in severity or frequency of epistaxis.

## 2024-06-04 ENCOUNTER — Encounter

## 2024-06-04 DIAGNOSIS — Z1211 Encounter for screening for malignant neoplasm of colon: Secondary | ICD-10-CM

## 2024-06-04 MED ORDER — NA SULFATE-K SULFATE-MG SULF 17.5-3.13-1.6 GM/177ML PO SOLN: 1.0000 | Freq: Once | ORAL | 0 refills | Status: AC

## 2024-06-04 NOTE — Progress Notes (Signed)
 Pre visit completed via phone call; Patient verified name, DOB, and address; No egg or soy allergy known to patient;  No issues known to pt with past sedation with any surgeries or procedures; Patient denies ever being told they had issues or difficulty with intubation;  No FH of Malignant Hyperthermia; Pt is not on diet pills; Pt is not on home 02;  Pt is not on blood thinners  Pt denies issues with constipation;  No A fib or A flutter; Have any cardiac testing pending--NO Insurance verified during PV appt--- [employer]P Pt can ambulate without assistance;  Pt denies use of chewing tobacco; Discussed diabetic/weight loss medication holds; Discussed NSAID holds; Checked BMI to be less than 50; Pt instructed to use Singlecare.com or GoodRx for a price reduction on prep;  Patient's chart reviewed by Rogena Class CNRA prior to previsit and patient appropriate for the [facility]; Pre visit completed and red dot placed by patient's name on their procedure day (on provider's schedule);  Instructions sent to MyChart per patient request;

## 2024-06-10 ENCOUNTER — Other Ambulatory Visit: Payer: Self-pay | Admitting: Surgery

## 2024-06-10 DIAGNOSIS — E041 Nontoxic single thyroid nodule: Secondary | ICD-10-CM

## 2024-06-10 NOTE — Progress Notes (Unsigned)
" ° ° ° ° ° ° °"

## 2024-06-14 DIAGNOSIS — G47419 Narcolepsy without cataplexy: Secondary | ICD-10-CM | POA: Insufficient documentation

## 2024-06-14 NOTE — Procedures (Signed)
 "     [facility]  Cindy Pittman 49 year old female [birth year]   HOME SLEEP TEST REPORT ( by Elene  mail -out device )    Study Protocol:     The SANSA FDA cleared and DOT approved type 4 home sleep test device measures eight physiological channels,  including blood oxygen saturation (measured via PPG [photoplethysmography]), EKG-derived heart rate, respiratory effort, chest movement (measured via accelerometer), snoring, body position, and actigraphy. The device is designed to be worn for up to 10 hours per study.    STUDY DATE:  05-17-2024 Data received :  06-09-2024     ORDERING CLINICIAN: Dedra Gores, MD  REFERRING CLINICIAN:  kennyth Bart MD   CLINICAL INFORMATION/HISTORY: Cindy Pittman 49 year old female 1975/08/24, was seen on 05-07-2024 for concerns of excessive daytime sleepiness:    In short, Mrs Arey is presenting with excessive daytime sleepiness that came to light as she changed job locations and now has a 30 minute commute, but the sleepiness is present for 10 years or more.   Her father had OSA and left it untreated, developed CHF. ,Risk factors for OSA were present,  including : Body mass index is 28.66 kg/m.,  normal neck size and unremarkable upper airway anatomy. My Plan is to proceed with: HST/ PSG/ NARCOLEPSY PANEL/  May use Modafinil for driving safety if OSA or idiopathic hypersomnia are diagnosed.  I plan to follow up personally within the next  4-6 months.  Epworth sleepiness score: ESS =17 / 24 points.  FSS endorsed at 45/ 63 points.   BMI:28 .7 kg/m  Neck Circumference: 14 , nasal congestion, overbite noted.    FINDINGS   Start  Recording Time (hours, min):   05-19-2024. 21;09 hours       Total Sleep Time (hours, min):    7 h and 19 minutes  Sleep efficiency %;     86%                                  Respiratory Indices by AASM  criteria of scoring;    Calculated pAHI (per hour):  3/h  , (slightly higher in supine  position but still AHI < 5 /h)                                             Respiratory activity  / snoring :  regular breathing, no snoring, no elevated muscle tone.   Oxygen Saturation  in Sleep :   Oxygen Saturation (%) Mean:  96.4%                  O2 Saturation Range (%):  nadir of 89 % , maximum at 10%                                      O2 Saturation (minutes) <89%:    0 minutes     Pulse Rate in Sleep :   Pulse Mean (bpm):   63 bpm  and regular.             Pulse Range:    50-85 bpm.  IMPRESSION:  This HST did not detect any sleep apnea, cardiac arrhythmia and no abnormal sleep architecture.     RECOMMENDATION:  With the degree of daytime sleepiness reflected in the Epworth sleepiness score of 17/ 24 points , FSS at 45,  I would like to follow up with a complete narcolepsy/ idiopathic hypersomnia work -up , including PSG and MSLT  in lab .  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    Any Patient endorsing a high level of sleepiness should be cautioned not to drive, work at heights, or operate dangerous machinery or heavy equipment when tired or sleepy.  Review of good sleep hygiene measures took place in the initial consultation but should be revisited ( Your guide to better sleep  a publication by the NIH is a good source of information).   The referring provider will be notified of the test results.    I certify that I have reviewed the raw data recording prior to the issuance of this report in accordance with the standards of the American Academy of Sleep Medicine (AASM).    INTERPRETING PHYSICIAN:  Dedra Gores, MD  [facility] Sleep Board certified by The [employer] of Sleep Medicine and Diplomate of the [employer] of Sleep Medicine. Board certified In Neurology through the ABPN, Fellow of the [employer] of Neu[employer].        "

## 2024-06-17 ENCOUNTER — Encounter: Payer: Self-pay | Admitting: Pediatrics

## 2024-06-18 NOTE — Progress Notes (Unsigned)
 [facility] History and Physical   Primary Care Physician:  Kennyth Worth HERO, MD   Reason for Procedure:  Colorectal cancer screening  Plan:    Screening colonoscopy   The patient was provided an opportunity to ask questions and all were answered. The patient agreed with the plan.   HPI: Cindy Pittman is a 49 y.o. female undergoing screening colonoscopy for colorectal cancer screening.  This is the patient's first colonoscopy.  Cologuard was negative 3 years ago.  No family history of colorectal cancer or polyps.  Patient denies current symptoms of rectal bleeding or change in bowel habits.   Past Medical History:  Diagnosis Date   Asthma    exercised induced asthma   Endometriosis    History of ear infections    Hyperlipidemia     Ideal LDL goal = < 70 based on NMR Lipoprofile   PCOS (polycystic ovarian syndrome)     Past Surgical History:  Procedure Laterality Date   Bilateral turbinate reduction, concha bullosa Bilateral 10/19/2023   Labial Cystectomy     LUMBAR DISC SURGERY  06/13/1988   L4, Dr Carles   Otic Tubes     SEPTOPLASTY     WISDOM TOOTH EXTRACTION      Prior to Admission medications  Medication Sig Start Date End Date Taking? Authorizing Provider  albuterol  (VENTOLIN  HFA) 108 (90 Base) MCG/ACT inhaler Inhale 2 puffs into the lungs every 4 (four) hours as needed for wheezing or shortness of breath. 04/08/24   Kennyth Worth HERO, MD  azelastine  (ASTELIN ) 0.1 % nasal spray Place 2 sprays into both nostrils 2 (two) times daily. 04/08/24   Kennyth Worth HERO, MD  budesonide -formoterol  (SYMBICORT ) 80-4.5 MCG/ACT inhaler Inhale 2 puffs into the lungs in the morning and at bedtime. TAKE 2 PUFFS BY MOUTH TWICE A DAY 04/08/24   Kennyth Worth HERO, MD  cetirizine  (ZYRTEC ) 10 MG tablet Take 1 tablet (10 mg total) by mouth daily. 04/08/24   Kennyth Worth HERO, MD  Cholecalciferol (VITAMIN D -3) 25 MCG (1000 UT) CAPS Take by mouth. Patient taking differently: Take 1  capsule by mouth daily at 6 (six) AM.    [provider]  fluticasone  (FLONASE ) 50 MCG/ACT nasal spray Place 1 spray into both nostrils daily. 04/08/24   Kennyth Worth HERO, MD  ketoconazole  (NIZORAL ) 2 % cream Apply 1 Application topically 2 (two) times daily. 04/08/24   Kennyth Worth HERO, MD  Levonorgestrel-Ethinyl Estradiol (AMETHIA) 0.1-0.02 & 0.01 MG tablet  08/25/18   [provider]  montelukast  (SINGULAIR ) 10 MG tablet TAKE 1 TABLET BY MOUTH EVERYDAY AT BEDTIME Patient taking differently: Take 10 mg by mouth at bedtime. TAKE 1 TABLET BY MOUTH EVERYDAY AT BEDTIME 04/08/24   Kennyth Worth HERO, MD  triamcinolone  cream (KENALOG ) 0.5 % Apply 1 Application topically 3 (three) times daily. Patient taking differently: Apply 1 Application topically 3 (three) times daily as needed. 04/08/24   Kennyth Worth HERO, MD    Current Outpatient Medications  Medication Sig Dispense Refill   azelastine  (ASTELIN ) 0.1 % nasal spray Place 2 sprays into both nostrils 2 (two) times daily. 90 mL 3   cetirizine  (ZYRTEC ) 10 MG tablet Take 1 tablet (10 mg total) by mouth daily. 90 tablet 3   fluticasone  (FLONASE ) 50 MCG/ACT nasal spray Place 1 spray into both nostrils daily. 48 g 3   Levonorgestrel-Ethinyl Estradiol (AMETHIA) 0.1-0.02 & 0.01 MG tablet  (Patient taking differently: Take 1 tablet by mouth daily.)     montelukast  (  SINGULAIR ) 10 MG tablet TAKE 1 TABLET BY MOUTH EVERYDAY AT BEDTIME (Patient taking differently: Take 10 mg by mouth at bedtime. TAKE 1 TABLET BY MOUTH EVERYDAY AT BEDTIME) 90 tablet 3   albuterol  (VENTOLIN  HFA) 108 (90 Base) MCG/ACT inhaler Inhale 2 puffs into the lungs every 4 (four) hours as needed for wheezing or shortness of breath. 36 g 1   budesonide -formoterol  (SYMBICORT ) 80-4.5 MCG/ACT inhaler Inhale 2 puffs into the lungs in the morning and at bedtime. TAKE 2 PUFFS BY MOUTH TWICE A DAY 30.6 g 3   Cholecalciferol (VITAMIN D -3) 25 MCG (1000 UT) CAPS Take by mouth. (Patient taking  differently: Take 1 capsule by mouth daily at 6 (six) AM.)     ketoconazole  (NIZORAL ) 2 % cream Apply 1 Application topically 2 (two) times daily. 60 g 0   triamcinolone  cream (KENALOG ) 0.5 % Apply 1 Application topically 3 (three) times daily. (Patient taking differently: Apply 1 Application topically 3 (three) times daily as needed.) 30 g 3   Current Facility-Administered Medications  Medication Dose Route Frequency Provider Last Rate Last Admin   0.9 %  sodium chloride  infusion  500 mL Intravenous Once Priscilla Kirstein, Inocente HERO, MD        Allergies as of 06/19/2024 - Review Complete 06/19/2024  Allergen Reaction Noted   Codeine Nausea And Vomiting 06/04/2024   Sulfonamide derivatives Rash 07/31/2007   Asparagus Hives, Itching, Swelling, and Rash 10/17/2023    Family History  Problem Relation Age of Onset   Heart attack Mother 39       Smoker   Diabetes Mother    Breast cancer Mother    Diabetes Father    Gout Father    Heart attack Maternal Grandfather 60   Seizures Paternal Grandfather    Diabetes Cousin    Stroke Neg Hx    Colon polyps Neg Hx    Colon cancer Neg Hx    Esophageal cancer Neg Hx    Stomach cancer Neg Hx    Rectal cancer Neg Hx     Social History   Socioeconomic History   Marital status: Single    Spouse name: Not on file   Number of children: Not on file   Years of education: Not on file   Highest education level: Not on file  Occupational History   Not on file  Tobacco Use   Smoking status: Never   Smokeless tobacco: Never  Vaping Use   Vaping status: Never Used  Substance and Sexual Activity   Alcohol use: Yes    Comment: Rarely   Drug use: No   Sexual activity: Not Currently  Other Topics Concern   Not on file  Social History Narrative   Not on file   Social Drivers of Health   Tobacco Use: Low Risk (06/19/2024)   Patient History    Smoking Tobacco Use: Never    Smokeless Tobacco Use: Never    Passive Exposure: Not on file  Financial  Resource Strain: Not on file  Food Insecurity: Not on file  Transportation Needs: Not on file  Physical Activity: Not on file  Stress: Not on file  Social Connections: Not on file  Intimate Partner Violence: Not on file  Depression (EYV7-0): Low Risk (04/08/2024)   Depression (PHQ2-9)    PHQ-2 Score: 0  Alcohol Screen: Not on file  Housing: Unknown (06/04/2024)   Received from [facility]   Epic    Unable to Pay for Housing in the Last  Year: Not on file    Number of Times Moved in the Last Year: Not on file    At any time in the past 12 months, were you homeless or living in a shelter (including now)?: No  Utilities: Not on file  Health Literacy: Not on file    Review of Systems:  All other review of systems negative except as mentioned in the HPI.  Physical Exam: Vital signs BP (!) 146/84   Pulse 62   Temp 97.9 F (36.6 C) (Temporal)   Resp 14   Ht 5' 7 (1.702 m)   Wt 184 lb (83.5 kg)   SpO2 100%   BMI 28.82 kg/m   General:   Alert,  Well-developed, well-nourished, pleasant and cooperative in NAD Airway:  Mallampati 2 Lungs:  Clear throughout to auscultation.   Heart:  Regular rate and rhythm; no murmurs, clicks, rubs,  or gallops. Abdomen:  Soft, nontender and nondistended. Normal bowel sounds.   Neuro/Psych:  Normal mood and affect. A and O x 3  Inocente Hausen, MD [facility]

## 2024-06-19 ENCOUNTER — Ambulatory Visit (AMBULATORY_SURGERY_CENTER): Admitting: Pediatrics

## 2024-06-19 ENCOUNTER — Encounter: Payer: Self-pay | Admitting: Pediatrics

## 2024-06-19 VITALS — BP 118/78 | HR 55 | Temp 97.9°F | Resp 18 | Ht 67.0 in | Wt 184.0 lb

## 2024-06-19 DIAGNOSIS — Z1211 Encounter for screening for malignant neoplasm of colon: Secondary | ICD-10-CM

## 2024-06-19 MED ORDER — SODIUM CHLORIDE 0.9 % IV SOLN: 500.0000 mL | Freq: Once | INTRAVENOUS | Status: DC

## 2024-06-19 NOTE — Op Note (Signed)
 [facility] Patient Name: Cindy Pittman Procedure Date: 06/19/2024 10:49 AM MRN: [id number] Endoscopist: Inocente Hausen , MD, [id number] Age: 49 Referring MD:  Date of Birth: [birth year] Gender: Female Account #: [id number] Procedure:                Colonoscopy Indications:              Screening for colorectal malignant neoplasm, This                            is the patient's first colonoscopy Medicines:                Monitored Anesthesia Care Procedure:                Pre-Anesthesia Assessment:                           - Prior to the procedure, a History and Physical                            was performed, and patient medications and                            allergies were reviewed. The patient's tolerance of                            previous anesthesia was also reviewed. The risks                            and benefits of the procedure and the sedation                            options and risks were discussed with the patient.                            All questions were answered, and informed consent                            was obtained. Prior Anticoagulants: The patient has                            taken no anticoagulant or antiplatelet agents. ASA                            Grade Assessment: II - A patient with mild systemic                            disease. After reviewing the risks and benefits,                            the patient was deemed in satisfactory condition to                            undergo the procedure.  After obtaining informed consent, the colonoscope                            was passed under direct vision. Throughout the                            procedure, the patient's blood pressure, pulse, and                            oxygen saturations were monitored continuously. The                            Olympus CF-HQ190L ([id number]) Colonoscope was                            introduced through the anus  and advanced to the                            cecum, identified by appendiceal orifice and                            ileocecal valve. The colonoscopy was performed                            without difficulty. The patient tolerated the                            procedure well. The quality of the bowel                            preparation was good. The ileocecal valve,                            appendiceal orifice, and rectum were photographed. Scope In: 10:59:53 AM Scope Out: 11:11:14 AM Scope Withdrawal Time: 0 hours 9 minutes 3 seconds  Total Procedure Duration: 0 hours 11 minutes 21 seconds  Findings:                 The perianal and digital rectal examinations were                            normal. Pertinent negatives include normal                            sphincter tone and no palpable rectal lesions.                           The colon (entire examined portion) appeared normal.                           The retroflexed view of the distal rectum and anal                            verge was normal and showed no anal or rectal  abnormalities. Complications:            No immediate complications. Estimated blood loss:                            None. Estimated Blood Loss:     Estimated blood loss: none. Impression:               - The entire examined colon is normal.                           - No specimens collected. Recommendation:           - Discharge patient to home (ambulatory).                           - Repeat colonoscopy in 10 years for screening                            purposes.                           - The findings and recommendations were discussed                            with the patient's family.                           - Return to referring physician.                           - Patient has a contact number available for                            emergencies. The signs and symptoms of potential                             delayed complications were discussed with the                            patient. Return to normal activities tomorrow.                            Written discharge instructions were provided to the                            patient. Inocente Hausen, MD 06/19/2024 11:15:01 AM This report has been signed electronically.

## 2024-06-19 NOTE — Progress Notes (Signed)
 Report to PACU, RN, vss, BBS= Clear.

## 2024-06-19 NOTE — Patient Instructions (Signed)
 Resume previous diet and medications. Repeat Colonoscopy in 10 years for screening purposes.  YOU HAD AN ENDOSCOPIC PROCEDURE TODAY AT THE [facility]:   Refer to the procedure report that was given to you for any specific questions about what was found during the examination.  If the procedure report does not answer your questions, please call your gastroenterologist to clarify.  If you requested that your care partner not be given the details of your procedure findings, then the procedure report has been included in a sealed envelope for you to review at your convenience later.  YOU SHOULD EXPECT: Some feelings of bloating in the abdomen. Passage of more gas than usual.  Walking can help get rid of the air that was put into your GI tract during the procedure and reduce the bloating. If you had a lower endoscopy (such as a colonoscopy or flexible sigmoidoscopy) you may notice spotting of blood in your stool or on the toilet paper. If you underwent a bowel prep for your procedure, you may not have a normal bowel movement for a few days.  Please Note:  You might notice some irritation and congestion in your nose or some drainage.  This is from the oxygen used during your procedure.  There is no need for concern and it should clear up in a day or so.  SYMPTOMS TO REPORT IMMEDIATELY:  Following lower endoscopy (colonoscopy or flexible sigmoidoscopy):  Excessive amounts of blood in the stool  Significant tenderness or worsening of abdominal pains  Swelling of the abdomen that is new, acute  Fever of 100F or higher  For urgent or emergent issues, a gastroenterologist can be reached at any hour by calling [phone]. Do not use MyChart messaging for urgent concerns.    DIET:  We do recommend a small meal at first, but then you may proceed to your regular diet.  Drink plenty of fluids but you should avoid alcoholic beverages for 24 hours.  ACTIVITY:  You should plan to take it easy  for the rest of today and you should NOT DRIVE or use heavy machinery until tomorrow (because of the sedation medicines used during the test).    FOLLOW UP: Our staff will call the number listed on your records the next business day following your procedure.  We will call around 7:15- 8:00 am to check on you and address any questions or concerns that you may have regarding the information given to you following your procedure. If we do not reach you, we will leave a message.     If any biopsies were taken you will be contacted by phone or by letter within the next 1-3 weeks.  Please call us at [phone] if you have not heard about the biopsies in 3 weeks.    SIGNATURES/CONFIDENTIALITY: You and/or your care partner have signed paperwork which will be entered into your electronic medical record.  These signatures attest to the fact that that the information above on your After Visit Summary has been reviewed and is understood.  Full responsibility of the confidentiality of this discharge information lies with you and/or your care-partner.

## 2024-06-19 NOTE — Progress Notes (Signed)
 Pt's states no medical or surgical changes since previsit or office visit.

## 2024-06-20 ENCOUNTER — Telehealth: Payer: Self-pay

## 2024-06-20 NOTE — Telephone Encounter (Signed)
" °  Follow up Call-     06/19/2024   10:40 AM  Call back number  Post procedure Call Back phone  # [phone]  Permission to leave phone message Yes     Patient questions:  Do you have a fever, pain , or abdominal swelling? No. Pain Score  0 *  Have you tolerated food without any problems? Yes.    Have you been able to return to your normal activities? Yes.    Do you have any questions about your discharge instructions: Diet   No. Medications  No. Follow up visit  No.  Do you have questions or concerns about your Care? No.  Actions: * If pain score is 4 or above: No action needed, pain <4.   "

## 2024-06-25 ENCOUNTER — Ambulatory Visit: Admitting: Orthopedic Surgery

## 2024-06-25 ENCOUNTER — Other Ambulatory Visit: Payer: Self-pay

## 2024-06-25 DIAGNOSIS — M6702 Short Achilles tendon (acquired), left ankle: Secondary | ICD-10-CM

## 2024-06-25 DIAGNOSIS — M21612 Bunion of left foot: Secondary | ICD-10-CM

## 2024-06-25 DIAGNOSIS — M21619 Bunion of unspecified foot: Secondary | ICD-10-CM

## 2024-06-25 DIAGNOSIS — M2022 Hallux rigidus, left foot: Secondary | ICD-10-CM | POA: Diagnosis not present

## 2024-06-26 ENCOUNTER — Encounter: Payer: Self-pay | Admitting: Orthopedic Surgery

## 2024-06-26 NOTE — Progress Notes (Signed)
 "  Office Visit Note   Patient: Cindy Pittman           Date of Birth: [birth year]           MRN: [id number] Visit Date: 06/25/2024              Requested by: Kennyth Worth HERO, MD [address] [place],  [place] [address] PCP: Kennyth Worth HERO, MD  Chief Complaint  Patient presents with   Left Foot - Pain      HPI: Discussed the use of AI scribe software for clinical note transcription with the patient, who gave verbal consent to proceed.  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  She has pain and marked stiffness in the left great toe, localized to the metatarsophalangeal (MTP) joint, with minimal dorsiflexion. Symptoms are exacerbated by activities requiring toe dorsiflexion, including walking, playing tennis, weightlifting, and reverse lunges. Flexible shoes worsen discomfort by forcing excessive toe flexion, and she notes pain at the dorsal MTP joint, especially with direct pressure. Despite using rigid shoes, she continues to experience pain after prolonged activity, such as hiking. Stiffness and pain limit certain movements, although she remains active.  She is concerned about the impact of her symptoms on her ability to maintain an active lifestyle but continues to exercise despite discomfort.  She sustained a significant left Achilles tendon injury approximately ten years ago, requiring immobilization in a boot and an eight-month recovery. Since then, she has persistent tightness in the left Achilles tendon and reduced ankle dorsiflexion compared to the right. She occasionally experiences tightness extending to the knees and acknowledges inconsistent stretching.     Assessment & Plan: Visit Diagnoses:  1. Bunion   2. Hallux rigidus, left foot   3. Contracture of left Achilles tendon     Plan: Assessment and Plan Assessment &  Plan Hallux rigidus of the left great toe Hallux rigidus with minimal dorsiflexion and osteophytic spurs. Radiographs show preserved joint space, supporting conservative management. Surgical options discussed for future if symptoms progress. - Ordered x-ray of the left great toe MTP joint. - Recommended stiff-soled sneaker or carbon fiber insert to limit MTP dorsiflexion and reduce pain. - Advised topical diclofenac (Voltaren) gel as needed for analgesia. - Provided education on the condition and conservative management strategies. - Advised follow-up if symptoms worsen.  Left Achilles tendon contracture Contracture with reduced dorsiflexion, increasing forefoot pressure and exacerbating hallux rigidus. Chronic tendon tightness post-injury requires ongoing stretching. - Demonstrated and instructed on Achilles tendon stretching exercises to be performed three times daily for one minute per session. - Emphasized the importance of stretching to improve ankle dorsiflexion and decrease stress on the great toe. - Provided anticipatory guidance regarding the chronicity of tendon tightness and the need for continued stretching.      Follow-Up Instructions: No follow-ups on file.   Ortho Exam  Patient is alert, oriented, no adenopathy, well-dressed, normal affect, normal respiratory effort. Physical Exam EXTREMITIES: Good palpable dorsalis pedis pulse on the left. MUSCULOSKELETAL: Osteophytic bone spurs over the dorsum of the left great toe MTP joint. Dorsiflexion at 20 degrees on the right, 10 degrees short of neutral on the left. Good joint space of the left great toe MTP joint.      Imaging: No results found. No images are attached to the encounter.  Labs: Lab Results  Component  Value Date   HGBA1C 5.5 04/08/2024   HGBA1C 5.4 04/06/2023   HGBA1C 5.4 01/14/2022   CRP <1.0 04/06/2023   LABURIC 6.4 04/02/2008   REPTSTATUS 10/17/2021 FINAL 10/15/2021   CULT  10/15/2021    NO GROUP A  STREP (S.PYOGENES) ISOLATED Performed at [facility], [address] [place], [place] [address]      Lab Results  Component Value Date   ALBUMIN 4.2 04/08/2024   ALBUMIN 4.0 04/06/2023   ALBUMIN 4.5 01/14/2022    No results found for: MG Lab Results  Component Value Date   VD25OH 66.79 04/08/2024   VD25OH 22.04 (L) 01/14/2022    No results found for: PREALBUMIN    Latest Ref Rng & Units 04/08/2024    8:53 AM 04/06/2023    8:36 AM 01/14/2022   10:55 AM  CBC EXTENDED  WBC 4.0 - 10.5 K/uL 7.1  7.6  7.0   RBC 3.87 - 5.11 Mil/uL 4.67  4.61  4.51   Hemoglobin 12.0 - 15.0 g/dL 85.8  86.2  86.0   HCT 36.0 - 46.0 % 41.6  41.5  40.7   Platelets 150.0 - 400.0 K/uL 320.0  324.0  298.0      There is no height or weight on file to calculate BMI.  Orders:  Orders Placed This Encounter  Procedures   XR Foot Complete Left   No orders of the defined types were placed in this encounter.    Procedures: No procedures performed  Clinical Data: No additional findings.  ROS:  All other systems negative, except as noted in the HPI. Review of Systems  Objective: Vital Signs: There were no vitals taken for this visit.  Specialty Comments:  No specialty comments available.  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   Chronic rhinitis 08/08/2023   Epistaxis 08/08/2023   Chronic pansinusitis 06/29/2023   Hypertrophy of nasal turbinates 06/29/2023   Adjustment disorder 04/06/2023   Dermatitis 01/07/2022   Thyroid  nodule 12/29/2020   Overweight 12/23/2019   Allergic rhinitis 12/11/2018   Dyslipidemia 10/12/2018   History of nephrolithiasis 10/12/2018   Exercise induced bronchospasm  06/30/2008   PCOS (polycystic ovarian syndrome) 07/18/2006   Past Medical History:  Diagnosis Date   Asthma    exercised induced asthma   Endometriosis    History of ear infections    Hyperlipidemia     Ideal LDL goal = < 70 based on NMR Lipoprofile   PCOS (polycystic ovarian syndrome)     Family History  Problem Relation Age of Onset   Heart attack Mother 22       Smoker   Diabetes Mother    Breast cancer Mother    Diabetes Father    Gout Father    Heart attack Maternal Grandfather 44   Seizures Paternal Grandfather    Diabetes Cousin    Stroke Neg Hx    Colon polyps Neg Hx    Colon cancer Neg Hx    Esophageal cancer Neg Hx    Stomach cancer Neg Hx    Rectal cancer Neg Hx     Past Surgical History:  Procedure Laterality Date   Bilateral turbinate reduction, concha bullosa Bilateral 10/19/2023   Labial Cystectomy  LUMBAR DISC SURGERY  06/13/1988   L4, Dr Carles   Otic Tubes     SEPTOPLASTY     WISDOM TOOTH EXTRACTION     Social History   Occupational History   Not on file  Tobacco Use   Smoking status: Never   Smokeless tobacco: Never  Vaping Use   Vaping status: Never Used  Substance and Sexual Activity   Alcohol use: Yes    Comment: Rarely   Drug use: No   Sexual activity: Not Currently         "

## 2024-07-03 ENCOUNTER — Telehealth: Payer: Self-pay | Admitting: Neurology

## 2024-07-03 NOTE — Telephone Encounter (Signed)
 Sent mychart message since patient has [employer] she will need to have a f/u before having the SS.

## 2024-07-15 ENCOUNTER — Inpatient Hospital Stay: Admission: RE | Admit: 2024-07-15 | Source: Ambulatory Visit

## 2024-07-16 ENCOUNTER — Telehealth: Payer: Self-pay | Admitting: *Deleted

## 2024-07-16 NOTE — Telephone Encounter (Signed)
 Please schedule an appt with Dr Kennyth for ADHD Tx Medication

## 2024-07-17 ENCOUNTER — Inpatient Hospital Stay: Admission: RE | Admit: 2024-07-17 | Discharge: 2024-07-17 | Attending: Surgery | Admitting: Surgery

## 2024-07-17 DIAGNOSIS — E041 Nontoxic single thyroid nodule: Secondary | ICD-10-CM

## 2024-07-17 NOTE — Procedures (Signed)
 Pre procedural Dx: dominant thyroid  nodule.  Post procedural Dx: Same  Technically successful US  guided biopsy of right dominant thyroid  nodule.   EBL: None.   Complications: None immediate.   KANDICE Banner, MD Pager #: [phone]

## 2024-07-17 NOTE — Telephone Encounter (Signed)
 Scheduled as vv for 07/18/24

## 2024-07-18 ENCOUNTER — Telehealth: Admitting: Family Medicine

## 2024-07-18 ENCOUNTER — Encounter: Payer: Self-pay | Admitting: Family Medicine

## 2024-07-18 ENCOUNTER — Telehealth: Payer: Self-pay

## 2024-07-18 ENCOUNTER — Other Ambulatory Visit (HOSPITAL_COMMUNITY): Payer: Self-pay

## 2024-07-18 VITALS — Ht 67.0 in | Wt 184.0 lb

## 2024-07-18 DIAGNOSIS — F909 Attention-deficit hyperactivity disorder, unspecified type: Secondary | ICD-10-CM | POA: Insufficient documentation

## 2024-07-18 DIAGNOSIS — G4730 Sleep apnea, unspecified: Secondary | ICD-10-CM

## 2024-07-18 DIAGNOSIS — E041 Nontoxic single thyroid nodule: Secondary | ICD-10-CM

## 2024-07-18 MED ORDER — LISDEXAMFETAMINE DIMESYLATE 20 MG PO CAPS: 20.0000 mg | ORAL_CAPSULE | Freq: Every day | ORAL | 0 refills | Status: AC

## 2024-07-18 NOTE — Assessment & Plan Note (Signed)
 Continue management per endocrinology.  Awaiting biopsy results

## 2024-07-18 NOTE — Progress Notes (Signed)
" ° °  Cindy Pittman is a 49 y.o. female who presents today for a virtual office visit.  Assessment/Plan:  Chronic Problems Addressed Today: ADHD We reviewed her recent ADHD evaluation with psychiatry.  She is having significant ADHD symptoms including difficulty with seeing a task to completion, feeling fidgety and restless and easily distracted.  Her psychologist had diagnosed her with ADHD and recommended medication treatment.  She was also being evaluated by narcolepsy and has upcoming sleep study as well.  We discussed treatment options.  Will start Vyvanse  20 mg daily.  We discussed potential side effects.  She will follow-up with us  in a couple of weeks and we can adjust as needed.  If she does not do well with Vyvanse  would consider Adderall XR.  Thyroid  nodule Continue management per endocrinology.  Awaiting biopsy results  Sleep-disordered breathing Following with neurology and is currently undergoing evaluation for potential narcolepsy.     Subjective:  HPI:  See Assessment / plan for status of chronic conditions.   Discussed the use of AI scribe software for clinical note transcription with the patient, who gave verbal consent to proceed.  History of Present Illness Cindy Pittman is a 49 year old female who presents for treatment options for ADHD.  She has been officially diagnosed with ADHD and is experiencing difficulties with focus and task completion. She feels like she 'can't follow through with anything' despite having completed four degrees in the past. These symptoms have become increasingly difficult to manage over the last few years.  She is undergoing evaluation for narcolepsy. She has completed a blood test and a take-home sleep apnea test, and is scheduled for an overnight sleep study on March 2nd. She is concerned about how ADHD medication might affect her upcoming sleep study.  Her current medications include Zyrtec , which she will stop two weeks prior to  the sleep study.  She has a thyroid  nodule that has grown by about fifty percent. She recently had a biopsy and is consulting with a specialist about potentially removing at least one side of her thyroid . If the biopsy results indicate cancer, the entire thyroid  may be removed.         Objective/Observations  Physical Exam: Gen: NAD, resting comfortably Pulm: Normal work of breathing Neuro: Grossly normal, moves all extremities Psych: Normal affect and thought content  Virtual Visit via Video   I connected with Cindy Pittman on 07/18/24 at  9:00 AM EST by a video enabled telemedicine application and verified that I am speaking with the correct person using two identifiers. The limitations of evaluation and management by telemedicine and the availability of in person appointments were discussed. The patient expressed understanding and agreed to proceed.   Patient location: Home Provider location: [place] [employer] Persons participating in the virtual visit: Myself and Patient     Worth HERO. Kennyth, MD 07/18/2024 9:45 AM  "

## 2024-07-18 NOTE — Assessment & Plan Note (Signed)
 Following with neurology and is currently undergoing evaluation for potential narcolepsy.

## 2024-07-18 NOTE — Telephone Encounter (Signed)
 Pharmacy Patient Advocate Encounter   Received notification from [employer] that prior authorization for Lisdexamfetamine  Dimesylate 20MG  capsules is required/requested.   Insurance verification completed.   The patient is insured through [facility].   Per test claim: PA required; PA submitted to above mentioned insurance via Latent Key/confirmation #/EOC [facility] Status is pending

## 2024-07-18 NOTE — Assessment & Plan Note (Signed)
 We reviewed her recent ADHD evaluation with psychiatry.  She is having significant ADHD symptoms including difficulty with seeing a task to completion, feeling fidgety and restless and easily distracted.  Her psychologist had diagnosed her with ADHD and recommended medication treatment.  She was also being evaluated by narcolepsy and has upcoming sleep study as well.  We discussed treatment options.  Will start Vyvanse  20 mg daily.  We discussed potential side effects.  She will follow-up with us  in a couple of weeks and we can adjust as needed.  If she does not do well with Vyvanse  would consider Adderall XR.

## 2024-07-19 ENCOUNTER — Other Ambulatory Visit (HOSPITAL_COMMUNITY): Payer: Self-pay

## 2024-07-19 ENCOUNTER — Ambulatory Visit: Payer: Self-pay | Admitting: Surgery

## 2024-07-19 LAB — CYTOLOGY - NON PAP

## 2024-07-19 NOTE — Progress Notes (Signed)
 Good news!  Biopsy is benign.  We'll likely plan to proceed with right thyroid  lobectomy later this spring due to the size of this nodule and her symptoms.  [facility] - put on our list to contact in March about scheduling surgery.  Krystal Spinner, MD [facility] practice Office: [phone]

## 2024-07-19 NOTE — Telephone Encounter (Signed)
 Pharmacy Patient Advocate Encounter  Received notification from [facility] that Prior Authorization for  Lisdexamfetamine  Dimesylate 20MG  capsules  has been APPROVED from 07/18/24 to 07/19/27   PA #/Case ID/Reference #: [id number]

## 2024-07-19 NOTE — Telephone Encounter (Signed)
"   Patient aware Rx Lisdexamfetamine  Dimesylate 20MG  capsules  has been APPROVED from 07/18/24 to 07/19/27 "

## 2024-07-29 ENCOUNTER — Telehealth: Admitting: Neurology

## 2024-08-12 ENCOUNTER — Encounter

## 2024-08-13 ENCOUNTER — Encounter

## 2025-04-14 ENCOUNTER — Encounter: Admitting: Family Medicine
